# Patient Record
Sex: Female | Born: 1945 | Race: Asian | Hispanic: No | Smoking: Never smoker
Health system: Southern US, Community
[De-identification: ages and names within clinical notes are randomized; demographics above are authoritative.]

## PROBLEM LIST (undated history)

## (undated) DIAGNOSIS — T7840XA Allergy, unspecified, initial encounter: Secondary | ICD-10-CM

## (undated) DIAGNOSIS — R4189 Other symptoms and signs involving cognitive functions and awareness: Secondary | ICD-10-CM

## (undated) DIAGNOSIS — L309 Dermatitis, unspecified: Secondary | ICD-10-CM

## (undated) DIAGNOSIS — E039 Hypothyroidism, unspecified: Secondary | ICD-10-CM

## (undated) HISTORY — DX: Allergy, unspecified, initial encounter: T78.40XA

## (undated) HISTORY — DX: Other symptoms and signs involving cognitive functions and awareness: R41.89

## (undated) HISTORY — DX: Dermatitis, unspecified: L30.9

## (undated) HISTORY — DX: Hypothyroidism, unspecified: E03.9

---

## 1995-11-28 HISTORY — PX: OTHER SURGICAL HISTORY: SHX169

## 2015-02-10 ENCOUNTER — Emergency Department (HOSPITAL_COMMUNITY)
Admission: EM | Admit: 2015-02-10 | Discharge: 2015-02-10 | Disposition: A | Discharge status: Home / Self Care | Payer: Medicaid Other | Source: Home / Self Care | Attending: Family Medicine | Admitting: Family Medicine

## 2015-02-10 ENCOUNTER — Encounter (HOSPITAL_COMMUNITY): Payer: Self-pay | Admitting: Emergency Medicine

## 2015-02-10 DIAGNOSIS — H6012 Cellulitis of left external ear: Secondary | ICD-10-CM

## 2015-02-10 MED ORDER — CEFUROXIME AXETIL 500 MG PO TABS: 500.0000 mg | ORAL_TABLET | Freq: Two times a day (BID) | ORAL | Status: DC

## 2015-02-10 MED ORDER — MUPIROCIN 2 % EX OINT: 1.0000 "application " | TOPICAL_OINTMENT | Freq: Two times a day (BID) | CUTANEOUS | Status: DC

## 2015-02-10 MED ORDER — NEOMYCIN-POLYMYXIN-HC 3.5-10000-1 OT SUSP: 4.0000 [drp] | Freq: Three times a day (TID) | OTIC | Status: DC

## 2015-02-10 NOTE — ED Provider Notes (Signed)
Sabrina Garrison is a 69 y.o. female who presents to Urgent Care today for bilateral ear pain for years. Patient notes the left ear is more tender than the right. She has not had any treatment yet. No change in hearing. No fevers or chills vomiting or diarrhea. Patient notes that she frequently coughs after meals and has a burning sensation in her esophagus and a sour taste in her mouth. She has not had any medications for this either.   History reviewed. No pertinent past medical history. History reviewed. No pertinent past surgical history. History  Substance Use Topics  . Smoking status: Never Smoker   . Smokeless tobacco: Not on file  . Alcohol Use: No   ROS as above Medications: No current facility-administered medications for this encounter.   Current Outpatient Prescriptions  Medication Sig Dispense Refill  . cefUROXime (CEFTIN) 500 MG tablet Take 1 tablet (500 mg total) by mouth 2 (two) times daily with a meal. 14 tablet 0  . mupirocin ointment (BACTROBAN) 2 % Apply 1 application topically 2 (two) times daily. 30 g 0  . neomycin-polymyxin-hydrocortisone (CORTISPORIN) 3.5-10000-1 otic suspension Place 4 drops into both ears 3 (three) times daily. 10 mL 0   No Known Allergies   Exam:  BP 111/62 mmHg  Pulse 63  Temp(Src) 98 F (36.7 C) (Oral)  Resp 15  SpO2 99% Gen: Well NAD HEENT: EOMI,  MMM left ear tragus is inflamed and tender with erythema. The ear canal is mildly erythematous. The right ear canal is normal-appearing. Mastoids are nontender bilaterally Lungs: Normal work of breathing. CTABL Heart: RRR no MRG Abd: NABS, Soft. Nondistended, Nontender Exts: Brisk capillary refill, warm and well perfused.   No results found for this or any previous visit (from the past 24 hour(s)). No results found.  Assessment and Plan: 69 y.o. female with  1) bilateral ear pain with left ear cellulitis. Treat with Ceftin, mupirocin and Cortisporin drops 2) reflux likely cause of chronic  cough. Doubtful for aspiration with normal oxygen saturation and normal lung exam. Treat with a trial of omeprazole. Follow-up with PCP.   [place] phone interpreter used  Discussed warning signs or symptoms. Please see discharge instructions. Patient expresses understanding.     Rodolph BongEvan S Alexcia Schools, MD 02/10/15 [phone]

## 2015-02-10 NOTE — Discharge Instructions (Signed)
Thank you for coming in today. ° °Cellulitis °Cellulitis is an infection of the skin and the tissue beneath it. The infected area is usually red and tender. Cellulitis occurs most often in the arms and lower legs.  °CAUSES  °Cellulitis is caused by bacteria that enter the skin through cracks or cuts in the skin. The most common types of bacteria that cause cellulitis are staphylococci and streptococci. °SIGNS AND SYMPTOMS  °· Redness and warmth. °· Swelling. °· Tenderness or pain. °· Fever. °DIAGNOSIS  °Your health care provider can usually determine what is wrong based on a physical exam. Blood tests may also be done. °TREATMENT  °Treatment usually involves taking an antibiotic medicine. °HOME CARE INSTRUCTIONS  °· Take your antibiotic medicine as directed by your health care provider. Finish the antibiotic even if you start to feel better. °· Keep the infected arm or leg elevated to reduce swelling. °· Apply a warm cloth to the affected area up to 4 times per day to relieve pain. °· Take medicines only as directed by your health care provider. °· Keep all follow-up visits as directed by your health care provider. °SEEK MEDICAL CARE IF:  °· You notice red streaks coming from the infected area. °· Your red area gets larger or turns dark in color. °· Your bone or joint underneath the infected area becomes painful after the skin has healed. °· Your infection returns in the same area or another area. °· You notice a swollen bump in the infected area. °· You develop new symptoms. °· You have a fever. °SEEK IMMEDIATE MEDICAL CARE IF:  °· You feel very sleepy. °· You develop vomiting or diarrhea. °· You have a general ill feeling (malaise) with muscle aches and pains. °MAKE SURE YOU:  °· Understand these instructions. °· Will watch your condition. °· Will get help right away if you are not doing well or get worse. °Document Released: 08/23/2005 Document Revised: 03/30/2014 Document Reviewed: 01/29/2012 °[employer]® Patient  Information ©2015 [employer]. This information is not intended to replace advice given to you by your health care provider. Make sure you discuss any questions you have with your health care provider. ° °

## 2015-02-10 NOTE — ED Notes (Signed)
[place] Pt c/o bilateral ear pain  See physicians note Alert, no signs of acute distress.

## 2015-03-02 ENCOUNTER — Emergency Department (HOSPITAL_COMMUNITY)
Admission: EM | Admit: 2015-03-02 | Discharge: 2015-03-02 | Disposition: A | Discharge status: Home / Self Care | Payer: Medicaid Other | Attending: Emergency Medicine | Admitting: Emergency Medicine

## 2015-03-02 DIAGNOSIS — Z79899 Other long term (current) drug therapy: Secondary | ICD-10-CM | POA: Diagnosis not present

## 2015-03-02 DIAGNOSIS — Z7952 Long term (current) use of systemic steroids: Secondary | ICD-10-CM | POA: Insufficient documentation

## 2015-03-02 DIAGNOSIS — Z792 Long term (current) use of antibiotics: Secondary | ICD-10-CM | POA: Insufficient documentation

## 2015-03-02 DIAGNOSIS — R21 Rash and other nonspecific skin eruption: Secondary | ICD-10-CM | POA: Diagnosis present

## 2015-03-02 DIAGNOSIS — L259 Unspecified contact dermatitis, unspecified cause: Secondary | ICD-10-CM | POA: Diagnosis not present

## 2015-03-02 MED ORDER — PREDNISONE 20 MG PO TABS: ORAL_TABLET | ORAL | Status: DC

## 2015-03-02 MED ORDER — DIPHENHYDRAMINE HCL 25 MG PO TABS: 25.0000 mg | ORAL_TABLET | Freq: Four times a day (QID) | ORAL | Status: DC

## 2015-03-02 MED ORDER — PREDNISONE 20 MG PO TABS
60.0000 mg | ORAL_TABLET | Freq: Once | ORAL | Status: AC
  Administered 2015-03-02: 60 mg via ORAL
  Filled 2015-03-02: qty 3

## 2015-03-02 MED ORDER — DIPHENHYDRAMINE HCL 25 MG PO CAPS
25.0000 mg | ORAL_CAPSULE | Freq: Once | ORAL | Status: AC
  Administered 2015-03-02: 25 mg via ORAL
  Filled 2015-03-02: qty 1

## 2015-03-02 NOTE — ED Notes (Signed)
Pt presents with allergic reaction XC 4 days to unknown source. Pt condition is acute persistent and worsening in nature. Condition is made worse by nothing. Condition is made better by nothing. Pt has had not treatment prior to arrival.

## 2015-03-02 NOTE — ED Notes (Signed)
MD at bedside. 

## 2015-03-02 NOTE — ED Notes (Signed)
Translator lin does not have a translator that speaks this dilaletic of burmese at this time. Verified allergies using friend on telephone

## 2015-03-02 NOTE — ED Provider Notes (Signed)
CSN: [id number]     Arrival date & time 03/02/15  6962 History   First MD Initiated Contact with Patient 03/02/15 [phone]     Chief Complaint  Patient presents with  . Allergic Reaction     (Consider location/radiation/quality/duration/timing/severity/associated sxs/prior Treatment) HPI  Communication limitation based on language barrier. The translator company did not have a Nurse, learning disability available for this particular [place]ese dialect. Via a friend on the telephone we were able to establish some history. The patient is coming in for a allergic reaction. There was not report of general illness as concern. Reviewing the prior medical record there is indication that the patient has had problems with chronic ear pain and was treated in mid March with mupirocin and neomycin for an early otitis externa. The patient should be finished with these medications based on their prescription date of March 16. No past medical history on file. No past surgical history on file. No family history on file. History  Substance Use Topics  . Smoking status: Never Smoker   . Smokeless tobacco: Not on file  . Alcohol Use: No   OB History    No data available     Review of Systems  Via the limitations of interview there is not report of acute illness.  Allergies  Review of patient's allergies indicates no known allergies.  Home Medications   Prior to Admission medications   Medication Sig Start Date End Date Taking? Authorizing Provider  cefUROXime (CEFTIN) 500 MG tablet Take 1 tablet (500 mg total) by mouth 2 (two) times daily with a meal. 02/10/15   Rodolph Bong, MD  diphenhydrAMINE (BENADRYL) 25 MG tablet Take 1 tablet (25 mg total) by mouth every 6 (six) hours. 03/02/15   Arby Barrette, MD  mupirocin ointment (BACTROBAN) 2 % Apply 1 application topically 2 (two) times daily. 02/10/15   Rodolph Bong, MD  neomycin-polymyxin-hydrocortisone (CORTISPORIN) 3.5-10000-1 otic suspension Place 4 drops into both ears 3  (three) times daily. 02/10/15   Rodolph Bong, MD  predniSONE (DELTASONE) 20 MG tablet 3 tabs po daily x 3 days, then 2 tabs x 3 days, then 1.5 tabs x 3 days, then 1 tab x 3 days, then 0.5 tabs x 3 days 03/02/15   Arby Barrette, MD   BP 103/58 mmHg  Temp(Src) 98.2 F (36.8 C) (Oral)  Resp 14  Ht 5' (1.524 m)  Wt 120 lb (54.432 kg)  BMI 23.44 kg/m2  SpO2 100% Physical Exam  Constitutional: She appears well-developed and well-nourished. No distress.  HENT:  The patient has bilateral periorbital edema. She is able to spontaneously open the eyes by about 5 mm. Extraocular motions are intact and there is no scleral injection or discharge. She has a diffuse edema and fine papular rash of the face. The intraoral cavity does not show any edema in the posterior or fax is widely patent. Teeth and tongue have black staining consistent with leaf chewing. There is no asymmetric facial swelling. Both of the patient's ears have diffuse erythema and edema. There is also papular rash at the back of the neck and high on the anterior chest. Some of this rash has some lichenification suggested of chronic itching behind the ears and at the nape of the neck. Also of note there is a bald patch above the left ear that is about 10 x 8 cm. Skin condition here is excellent without any rash present. The patient's neck is supple without meningismus. Right TM is normal appearance. Left  TM has tympanic sclerosis but no erythema or bulging.  Neck: Neck supple.  Cardiovascular: Normal rate, regular rhythm, normal heart sounds and intact distal pulses.   Pulmonary/Chest: Effort normal and breath sounds normal.  Abdominal: Soft. Bowel sounds are normal. She exhibits no distension. There is no tenderness.  Musculoskeletal: Normal range of motion. She exhibits no edema or tenderness.  Neurological: She is alert. No cranial nerve deficit. She exhibits normal muscle tone. Coordination normal.  Skin: Skin is warm and dry.  Aside from  the described rash on the patient's head and neck, the extremities and chronic are free of rash. She does have one focal lesion on the knee that is oval in shape and 7 x 5 cm. This lesion has a silvery top to it with no surrounding erythema. The knee itself has no effusion or deformity. 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 The daughter indicates its been there for several years.    ED Course  Procedures (including critical care time) Labs Review Labs Reviewed - No data to display  Imaging Review No results found.   EKG Interpretation None      MDM   Final diagnoses:  Contact dermatitis   Physical examination findings are consistent with contact dermatitis. There is some appearance of chronicity with some lichenification behind the ears and at the nape of the neck. There is also a focal area of alopecia on the side of the patient's head that does not have any associated rash. At this time she will be treated for contact dermatitis with prednisone and Benadryl. I have indicated to the patient's daughter the need to follow-up at the wellness clinic and establish a provider that can plan for communication were to bring a friend who can act as a Nurse, learning disabilitytranslator. The patient is nontoxic with stable vital signs and at this point appears stable for discharge.    Arby BarretteMarcy Marnie Fazzino, MD 03/02/15 1106

## 2015-03-02 NOTE — Discharge Instructions (Signed)

## 2015-09-17 ENCOUNTER — Encounter (HOSPITAL_COMMUNITY): Payer: Self-pay | Admitting: Emergency Medicine

## 2015-09-17 ENCOUNTER — Emergency Department (HOSPITAL_COMMUNITY)
Admission: EM | Admit: 2015-09-17 | Discharge: 2015-09-17 | Disposition: A | Discharge status: Home / Self Care | Payer: Medicaid Other | Attending: Emergency Medicine | Admitting: Emergency Medicine

## 2015-09-17 DIAGNOSIS — L259 Unspecified contact dermatitis, unspecified cause: Secondary | ICD-10-CM | POA: Insufficient documentation

## 2015-09-17 DIAGNOSIS — M542 Cervicalgia: Secondary | ICD-10-CM | POA: Diagnosis not present

## 2015-09-17 DIAGNOSIS — Z79899 Other long term (current) drug therapy: Secondary | ICD-10-CM | POA: Diagnosis not present

## 2015-09-17 DIAGNOSIS — R21 Rash and other nonspecific skin eruption: Secondary | ICD-10-CM | POA: Diagnosis present

## 2015-09-17 MED ORDER — PREDNISONE 20 MG PO TABS: ORAL_TABLET | ORAL | Status: DC

## 2015-09-17 MED ORDER — MUPIROCIN 2 % EX OINT: 1.0000 "application " | TOPICAL_OINTMENT | Freq: Two times a day (BID) | CUTANEOUS | Status: DC

## 2015-09-17 MED ORDER — CEPHALEXIN 500 MG PO CAPS: 500.0000 mg | ORAL_CAPSULE | Freq: Four times a day (QID) | ORAL | Status: DC

## 2015-09-17 NOTE — ED Notes (Addendum)
Pt here for rash and neck pain. Rash began in [facility], pt took medication and rash went away but returned. Pt speaks [place].

## 2015-09-17 NOTE — Discharge Instructions (Signed)
1. Medications: OTC Benadryl every 6 hours as needed for itching, bactroban, prednisone, keflex, usual home medications 2. Treatment: rest, drink plenty of fluids, take medications as prescribed 3. Follow Up: if you do not have a primary care doctor use the resource guide provided to find one; followup with dermatology as soon as possible; Return to the ER for difficulty breathing, return of allergic reaction or other concerning symptoms    Contact Dermatitis Dermatitis is redness, soreness, and swelling (inflammation) of the skin. Contact dermatitis is a reaction to certain substances that touch the skin. There are two types of contact dermatitis:   Irritant contact dermatitis. This type is caused by something that irritates your skin, such as dry hands from washing them too much. This type does not require previous exposure to the substance for a reaction to occur. This type is more common.  Allergic contact dermatitis. This type is caused by a substance that you are allergic to, such as a nickel allergy or poison ivy. This type only occurs if you have been exposed to the substance (allergen) before. Upon a repeat exposure, your body reacts to the substance. This type is less common. CAUSES  Many different substances can cause contact dermatitis. Irritant contact dermatitis is most commonly caused by exposure to:   Makeup.   Soaps.   Detergents.   Bleaches.   Acids.   Metal salts, such as nickel.  Allergic contact dermatitis is most commonly caused by exposure to:   Poisonous plants.   Chemicals.   Jewelry.   Latex.   Medicines.   Preservatives in products, such as clothing.  RISK FACTORS This condition is more likely to develop in:   People who have jobs that expose them to irritants or allergens.  People who have certain medical conditions, such as asthma or eczema.  SYMPTOMS  Symptoms of this condition may occur anywhere on your body where the irritant  has touched you or is touched by you. Symptoms include:  Dryness or flaking.   Redness.   Cracks.   Itching.   Pain or a burning feeling.   Blisters.  Drainage of small amounts of blood or clear fluid from skin cracks. With allergic contact dermatitis, there may also be swelling in areas such as the eyelids, mouth, or genitals.  DIAGNOSIS  This condition is diagnosed with a medical history and physical exam. A patch skin test may be performed to help determine the cause. If the condition is related to your job, you may need to see an occupational medicine specialist. TREATMENT Treatment for this condition includes figuring out what caused the reaction and protecting your skin from further contact. Treatment may also include:   Steroid creams or ointments. Oral steroid medicines may be needed in more severe cases.  Antibiotics or antibacterial ointments, if a skin infection is present.  Antihistamine lotion or an antihistamine taken by mouth to ease itching.  A bandage (dressing). HOME CARE INSTRUCTIONS Skin Care  Moisturize your skin as needed.   Apply cool compresses to the affected areas.  Try taking a bath with:  Epsom salts. Follow the instructions on the packaging. You can get these at your local pharmacy or grocery store.  Baking soda. Pour a small amount into the bath as directed by your health care provider.  Colloidal oatmeal. Follow the instructions on the packaging. You can get this at your local pharmacy or grocery store.  Try applying baking soda paste to your skin. Stir water into baking soda  until it reaches a paste-like consistency.  Do not scratch your skin.  Bathe less frequently, such as every other day.  Bathe in lukewarm water. Avoid using hot water. Medicines  Take or apply over-the-counter and prescription medicines only as told by your health care provider.   If you were prescribed an antibiotic medicine, take or apply your  antibiotic as told by your health care provider. Do not stop using the antibiotic even if your condition starts to improve. General Instructions  Keep all follow-up visits as told by your health care provider. This is important.  Avoid the substance that caused your reaction. If you do not know what caused it, keep a journal to try to track what caused it. Write down:  What you eat.  What cosmetic products you use.  What you drink.  What you wear in the affected area. This includes jewelry.  If you were given a dressing, take care of it as told by your health care provider. This includes when to change and remove it. SEEK MEDICAL CARE IF:   Your condition does not improve with treatment.  Your condition gets worse.  You have signs of infection such as swelling, tenderness, redness, soreness, or warmth in the affected area.  You have a fever.  You have new symptoms. SEEK IMMEDIATE MEDICAL CARE IF:   You have a severe headache, neck pain, or neck stiffness.  You vomit.  You feel very sleepy.  You notice red streaks coming from the affected area.  Your bone or joint underneath the affected area becomes painful after the skin has healed.  The affected area turns darker.  You have difficulty breathing.   This information is not intended to replace advice given to you by your health care provider. Make sure you discuss any questions you have with your health care provider.   Document Released: 11/10/2000 Document Revised: 08/04/2015 Document Reviewed: 03/31/2015 Elsevier Interactive Patient Education 2016 [employer].    Emergency Department Resource Guide 1) Find a Doctor and Pay Out of Pocket Although you won't have to find out who is covered by your insurance plan, it is a good idea to ask around and get recommendations. You will then need to call the office and see if the doctor you have chosen will accept you as a new patient and what types of options they offer  for patients who are self-pay. Some doctors offer discounts or will set up payment plans for their patients who do not have insurance, but you will need to ask so you aren't surprised when you get to your appointment.  2) Contact Your Local Health Department Not all health departments have doctors that can see patients for sick visits, but many do, so it is worth a call to see if yours does. If you don't know where your local health department is, you can check in your phone book. The CDC also has a tool to help you locate your state's health department, and many state websites also have listings of all of their local health departments.  3) Find a Walk-in Clinic If your illness is not likely to be very severe or complicated, you may want to try a walk in clinic. These are popping up all over the country in pharmacies, drugstores, and shopping centers. They're usually staffed by nurse practitioners or physician assistants that have been trained to treat common illnesses and complaints. They're usually fairly quick and inexpensive. However, if you have serious medical issues or chronic  medical problems, these are probably not your best option.  No Primary Care Doctor: - Call Health Connect at  [phone] - they can help you locate a primary care doctor that  accepts your insurance, provides certain services, etc. - Physician Referral Service- [phone]  Chronic Pain Problems: Organization         Address  Phone   Notes  [facility]  [phone] Patients need to be referred by their primary care doctor.   Medication Assistance: Organization         Address  Phone   Notes  [facility] Medication [facility] [address] [place], [place] [address] [phone] --Must be a resident of [place] -- Must have NO insurance coverage whatsoever (no Medicaid/ Medicare, etc.) -- The pt. MUST have a primary care doctor that directs their care  regularly and follows them in the community   [facility]ist  [phone]   [employer]  [phone]    Agencies that provide inexpensive medical care: Organization         Address  Phone   Notes  [facility]  [phone]   [facility]    [phone]   [facility] [address] [place], [place] [address] [phone]   [facility] [address] [place], [place] [phone]   Planned Parenthood    [phone]   [facility]    [phone]   [facility]  [address], [place] Phone:  [phone], Fax:  [phone] Hours of Operation:  9 am - 6 pm, M-F.  Also accepts Medicaid/Medicare and self-pay.  [facility]  [address], [place] Phone: [phone], Fax: [phone]. Hours of Operation:  8:30 am - 5:30 pm, M-F.  Also accepts Medicaid and self-pay.  [facility] [place] [address] [place] Phone: [phone]   [facility] [address] Natasha Bence [place], [place] [phone], Ext. [phone] Mondays & Thursdays: 7-9 AM.  First 15 patients are seen on a first come, first serve basis.    Medicaid-accepting [facility] Providers:  Organization         Address  Phone   Notes  [facility] [address], [place] [phone] Also accepts self-pay patients.  [facility] [address] Laurell Josephs [place], [place]  [phone]   [facility] [address], [place] [phone]   [facility] [address] [place] [phone]   Renaye Rakers [address], [place]   [phone] Only accepts [place] patients after they have their name applied to their card.   Self-Pay (no insurance) in [facility]:  Organization         Address  Phone    Notes  Sickle Cell Patients, [facility] Internal Medicine [address] [place], [place] [phone]   [facility] [address] [place], [place] [phone]   [facility]  [address], [place] [phone]   [facility]/Dr. Osei-Bonsu  [address], [place] or [address], [place] [phone] Phone number for both [place] and [place] locations is the same.  [facility] [address] [place] [phone]   [facility] [address] [place] or [address] [phone]   [facility] [address] [place] [phone], phone; [phone], fax Sees patients 1st and 3rd Saturday of every month.  Must not qualify for public or private insurance (i.e. Medicaid, Medicare, [place] Health Choice, Veterans' Benefits)  Household income should be no more than 200% of the poverty level The clinic cannot treat you if you are pregnant or think you are pregnant  Sexually transmitted diseases are not treated at the clinic.    Dental Care: Organization         Address  Phone  Notes  [facility] Department of [facility] [address] [place], [place] [phone] Accepts children up to age 36 who are enrolled in [place] or [place] Health Choice; pregnant women with a Medicaid card; and children who have applied for Medicaid or [place] Health Choice, but were declined, whose parents can pay a reduced fee at time of service.  [facility] of [facility]  [address], [place] [phone] Accepts children up to age 56 who are enrolled in [place] or [place] Health Choice; pregnant women with a Medicaid card; and children who have applied for Medicaid or  Health Choice, but were declined, whose parents can pay a reduced fee at time of service.  [facility]  [address] [place], [place] [phone] Patients are seen by appointment only. Walk-ins are not accepted. [facility] will see patients 72 years of age and older. Monday - Tuesday (8am-5pm) Most Wednesdays (8:30-5pm) $30 per visit, cash only  [facility] Adult Dental Access PROGRAM  [address], [facility] [phone] Patients are seen by appointment only. Walk-ins are not accepted. [facility] will see patients 35 years of age and older. One Wednesday Evening (Monthly: Volunteer Based).  $30 per visit, cash only  [employer]  [phone] for adults; Children under age 66, call Graduate Pediatric Dentistry at [phone]. Children aged 44-14, please call [phone] to request a pediatric application.  Dental services are provided in all areas of dental care including fillings, crowns and bridges, complete and partial dentures, implants, gum treatment, root canals, and extractions. Preventive care is also provided. Treatment is provided to both adults and children. Patients are selected via a lottery and there is often a waiting list.   [facility] [address], [place]  [phone] [email or web address]   [facility] [address] [place], [place] [phone], Ext. [phone] Second and Fourth Thursday of each month, opens at 6:30 AM; Clinic ends at 9 AM.  Patients are seen on a first-come first-served basis, and a limited number are seen during each clinic.   [facility]  [address] Ether Griffins [place], [place] [phone]   Eligibility Requirements You must have lived in [place], [place], or [place] counties for at least the last three months.   You cannot be eligible for state or federal sponsored [place], including [employer], [place], or [employer].   You generally cannot be eligible for healthcare insurance through your employer.    How to  apply: Eligibility screenings are held every Tuesday and Wednesday afternoon from 1:00 pm until 4:00 pm. You do not need an appointment for the interview!  [facility] [address], [place], [place] [phone]   [facility]  [phone]   [facility]  [phone]   [facility]  [phone]    Behavioral Health Resources in the Community: Intensive Outpatient Programs Organization         Address  Phone  Notes  [facility] [address], [place], [place] [phone]   [facility] [address], [place], [place] [phone]   [facility] [address], [place], [place]  [phone]   [facility] [address],  [place], [place] [phone] or [phone]   Substance Abuse Resources Organization         Address  Phone  Notes  [facility]  [phone]   [facility]  [phone]   [facility]  [phone]   [facility]  [phone]   Residential & Outpatient Substance Abuse Program  [phone]   Psychological Services Organization         Address  Phone  Notes  [facility]  [phone]- [phone]   [facility]  [phone]- [phone]   [facility] [address], [place] [phone] or [phone]    Mobile Crisis Teams Organization         Address  Phone  Notes  [facility], Mobile Crisis Care Unit  [phone]   [facility]  [address] [place], [place] [phone]   Doristine LocksSharon DeEsch [address] [place] [phone]    Self-Help/Support Groups Organization         Address  Phone             Notes  [facility] [place] - variety of support groups  [phone] Call for more information  Narcotics Anonymous (NA), [facility] [address], [place]  2 meetings at this location   Statisticianesidential Treatment Programs Organization         Address  Phone  Notes  [facility] [address],    [place]  [phone]   [facility]  [address], [place] [phone], [place], [place] [phone]   [facility] [address], [place] [phone] Admissions: 8am-3pm M-F  [facility] [address]    [place], [place] [phone]   [facility] [address], [place], [place] [phone]   [facility] [address]  [place], [place] [phone]   [facility] - Intensive Outpatient [address], [place], [place] [phone]   [facility] ([facility] [address]  [place], [place] [phone] or [phone]   [facility] ([facility]) [address] [place], [place] [phone] Accepts Medicaid  [facility] [address]  [place] [phone] Substance Abuse/Addiction Treatment   [facility] Resources Organization         Address  Phone  Notes  [facility]  [phone]   Angie FavaJulie Brannon, PhD [address], [address] [place], [place]   [phone] or [phone]   [facility]   [address] [place], [place] [phone]   [facility] [address], [place], [place] [phone] Insurance/Medicaid/sponsorship through [employer] [facility]  [facility] Families [address]  [place], [place] [phone] [place], [place] [phone]    Dr. Adele Schilder  [phone]   [facility] Dept. 1) [address] [place] 2) [place] 3)  [address], [place] [phone]  [phone]   [place]  [phone] or [phone] (After Hours)

## 2015-09-17 NOTE — ED Notes (Signed)
intepreter line used.

## 2015-09-17 NOTE — ED Provider Notes (Signed)
CSN: [id number]     Arrival date & time 09/17/15  1046 History  By signing my name below, I, Sabrina Garrison, attest that this documentation has been prepared under the direction and in the presence of [employer] CorpHannah Maisee Vollman, PA-C. Electronically Signed: Elon SpannerGarrett Garrison ED Scribe. 09/17/2015. 4:17 PM.    Chief Complaint  Patient presents with  . Neck Pain  . Rash   Patient is a 69 y.o. female presenting with rash. The history is provided by the patient and medical records. The history is limited by a language barrier. A language interpreter was used.  Rash Associated symptoms: no abdominal pain, no diarrhea, no fatigue, no fever, no headaches, no nausea, no shortness of breath, not vomiting and not wheezing    HPI Comments: Sabrina Garrison is a [age] y.o. female who presents to the Emergency Department complaining of a generalized, itching rash on the head, neck, underarms, and upper/inner thighs onset 01/2015.  She reports some associated pain superficially in the neck that she attributes to the rash.  Associated symptoms include intermittent chills, nausea, and difficulty sleeping due to pain. The patient was seen in the ED for the same 03/02/2015.  At that time she was diagnosed with contact dermatitis and prescribed benadryl and prednisone.  Her rash improved with these medications but did not resolve, eventually returning to its former state.  She was seen again by an unknown provider sometime between 02/2015 and 04/2015. At that time she was prescribed clobetasol ointment, Bactroban, Ceftin, and 2.5% hydrocortisone cream.  She has used these without improvement.  She has also taken Levaquin.  Patient denies international travel since [place] the states from [place] in 2013.  The patient began using a new soap two weeks ago with worsening itching.  She denies vomiting.  NKDA.  Per family member, with the exception of the rash, the patient's general appearance and skin color is normal to baseline. Patient is unsure of  any chronic kidney or liver conditions.    Patient was accompanied by a family member who provided parts of the history.     History reviewed. No pertinent past medical history. History reviewed. No pertinent past surgical history. No family history on file. Social History  Substance Use Topics  . Smoking status: Never Smoker   . Smokeless tobacco: None  . Alcohol Use: No   OB History    No data available     Review of Systems  Constitutional: Negative for fever, diaphoresis, appetite change, fatigue and unexpected weight change.  HENT: Negative for mouth sores.   Eyes: Negative for visual disturbance.  Respiratory: Negative for cough, chest tightness, shortness of breath and wheezing.   Cardiovascular: Negative for chest pain.  Gastrointestinal: Negative for nausea, vomiting, abdominal pain, diarrhea and constipation.  Endocrine: Negative for polydipsia, polyphagia and polyuria.  Genitourinary: Negative for dysuria, urgency, frequency and hematuria.  Musculoskeletal: Negative for back pain and neck stiffness.  Skin: Positive for rash.  Allergic/Immunologic: Negative for immunocompromised state.  Neurological: Negative for syncope, light-headedness and headaches.  Hematological: Does not bruise/bleed easily.  Psychiatric/Behavioral: Negative for sleep disturbance. The patient is not nervous/anxious.       Allergies  Review of patient's allergies indicates no known allergies.  Home Medications   Prior to Admission medications   Medication Sig Start Date End Date Taking? Authorizing Provider  cefUROXime (CEFTIN) 500 MG tablet Take 1 tablet (500 mg total) by mouth 2 (two) times daily with a meal. 02/10/15   Rodolph BongEvan S Corey, MD  cephALEXin (KEFLEX) 500 MG capsule Take 1 capsule (500 mg total) by mouth 4 (four) times daily. 09/17/15   Matej Sappenfield, PA-C  diphenhydrAMINE (BENADRYL) 25 MG tablet Take 1 tablet (25 mg total) by mouth every 6 (six) hours. 03/02/15   Arby Barrette, MD  mupirocin ointment (BACTROBAN) 2 % Apply 1 application topically 2 (two) times daily. 09/17/15   Lakeesha Fontanilla, PA-C  neomycin-polymyxin-hydrocortisone (CORTISPORIN) 3.5-10000-1 otic suspension Place 4 drops into both ears 3 (three) times daily. 02/10/15   Rodolph Bong, MD  predniSONE (DELTASONE) 20 MG tablet 3 tabs po daily x 3 days, then 2 tabs x 3 days, then 1.5 tabs x 3 days, then 1 tab x 3 days, then 0.5 tabs x 3 days 09/17/15   Dahlia Client Nyjae Hodge, PA-C   BP 111/73 mmHg  Pulse 68  Temp(Src) 97.6 F (36.4 C) (Oral)  Resp 18  Wt 114 lb 3 oz (51.795 kg)  SpO2 100% Physical Exam  Constitutional: She appears well-developed and well-nourished. No distress.  Awake, alert, nontoxic appearance  HENT:  Head: Normocephalic and atraumatic.  Mouth/Throat: Oropharynx is clear and moist. No oropharyngeal exudate.  Eyes: Conjunctivae are normal. No scleral icterus.  Neck: Normal range of motion. Neck supple.  Full range of motion of the neck No stridor Normal phonation Handling secretions without difficulty  Cardiovascular: Normal rate, regular rhythm, normal heart sounds and intact distal pulses.   No murmur heard. Pulmonary/Chest: Effort normal and breath sounds normal. No respiratory distress. She has no wheezes.  Equal chest expansion Clear and equal breath sounds without wheezing or rhonchi  Abdominal: Soft. Bowel sounds are normal. She exhibits no mass. There is no tenderness. There is no rebound and no guarding.  Musculoskeletal: Normal range of motion. She exhibits no edema.  Neurological: She is alert.  Speech is clear and goal oriented Moves extremities without ataxia  Skin: Skin is warm and dry. She is not diaphoretic.  Irritated and inflamed rash with edema and fissures noted circumferentially around the neck and extending onto the chest patches of this noted to the area and inferior portions of the bilateral breasts Rash noted to the majority of the bilateral  upper arms and along the right knee No purulent drainage, induration or increased warmth  Psychiatric: She has a normal mood and affect.  Nursing note and vitals reviewed.   ED Course  Procedures (including critical care time)  DIAGNOSTIC STUDIES: Oxygen Saturation is 96% on RA, adequate by my interpretation.    COORDINATION OF CARE:  12:01 PM Will consult with attending and prescribe oral steroids.  Patient will be referred to dermatologist.  Patient acknowledges and agrees with plan.     MDM   Final diagnoses:  Contact dermatitis  Rash and nonspecific skin eruption   Simisola Sandles presents with persistent rash.  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 Patient denies any difficulty breathing or swallowing.  Pt has a patent airway without stridor and is handling secretions without difficulty; no angioedema. No blisters, no pustules, no warmth, no draining sinus tracts, no superficial abscesses, no bullous impetigo, no vesicles, no desquamation, no target lesions with dusky purpura or a central bulla. Not tender to touch. Concern for beginning of secondary infection due to chronic excoriations and open fissures. No concern for SJS, TEN, TSS, tick borne illness, syphilis or other life-threatening condition. Will discharge home with short course of steroids along with Keflex and Bactroban. Discussed at length patients need to follow-up with  dermatology as she will need a biopsy of the rash.  BP 111/73 mmHg  Pulse 68  Temp(Src) 97.6 F (36.4 C) (Oral)  Resp 18  Wt 114 lb 3 oz (51.795 kg)  SpO2 100%  I personally performed the services described in this documentation, which was scribed in my presence. The recorded information has been reviewed and is accurate.   The patient was discussed with and seen by Dr. Karma Ganja who agrees with the treatment plan.    Dierdre Forth, PA-C 09/17/15 1617  Zoella Roberti, PA-C 09/17/15 [id number]  Jerelyn Scott, MD 09/18/15 [phone]

## 2015-11-28 DIAGNOSIS — E039 Hypothyroidism, unspecified: Secondary | ICD-10-CM

## 2015-11-28 HISTORY — DX: Hypothyroidism, unspecified: E03.9

## 2016-01-12 ENCOUNTER — Encounter: Payer: Self-pay | Admitting: Internal Medicine

## 2016-01-12 ENCOUNTER — Ambulatory Visit (INDEPENDENT_AMBULATORY_CARE_PROVIDER_SITE_OTHER): Payer: Medicaid Other | Admitting: Internal Medicine

## 2016-01-12 VITALS — BP 112/80 | HR 68 | Ht <= 58 in | Wt 120.0 lb

## 2016-01-12 DIAGNOSIS — R0600 Dyspnea, unspecified: Secondary | ICD-10-CM

## 2016-01-12 DIAGNOSIS — L299 Pruritus, unspecified: Secondary | ICD-10-CM

## 2016-01-12 DIAGNOSIS — H9193 Unspecified hearing loss, bilateral: Secondary | ICD-10-CM

## 2016-01-12 MED ORDER — TRIAMCINOLONE ACETONIDE 0.1 % EX CREA: TOPICAL_CREAM | CUTANEOUS | Status: DC

## 2016-01-12 NOTE — Progress Notes (Signed)
   Subjective:    Patient ID: Sabrina Garrison, female    DOB: [birth year], 70 y.o.   MRN: [id number]  HPI   1.  Hearing Problem:  Became a problem when pregnant with her 69 year old son.  Pt. States she was never told why she lost her hearing.  States she had pain in her ears, but no drainage.  Describes tinnitus constantly.    2.  History of neck surgery due to a large mass in her neck that was enlarging.  Has had problems with breathing since.  Surgery performed in [place] when at a refugee camp there.  Not a cancer.  Was taking a medication afterward, but stopped years ago.  Since surgery, voice has been a bit raspy and feels a bit dyspneic (from throat area)  3.  Itching:  All over.  Lives with 2 adult sons.  Has been bothering her for 1 year.  Lives in [place] at [address].     Review of Systems     Objective:   Physical Exam Delightfully spry woman HEENT:  PERRL, EOMI, TMs retracted clear and shiny, throat without injection. Neck:  Appears to be missing some musculature from right sternocleidomastoid muscle as well as central part of anterior neck--almost as if missing tracheal prominence.  No adenopathy, no mass Chest:  CTA, voice raspy and has to make a bit of an effort to speak.   CV:  RRR without murmur or rub, radial pulses normal and equal. Abd:  S, NT, No HSM or masses appreciated., +BS throughout Skin;  No rash, but very dry, has some thickening with redness and deeper skin folds about wrists and base of thumbs       Assessment & Plan:  1.  Loss of hearing with tinnitus:  Referral to ENT and audiology.  Will see if can get audiology done with ENT  2.  ?  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  Will ask ENT to evaluate as well, though likely nothing can be done at this point.  Will have patient return next week to have TSH checked when [place] back.  3.  Dry Skin with possibly mild eczema in  places:  Dove soap, rinse thoroughly, Eucerin Cream after bathing and one other time a day. Triamcinolone cream 0.1% to areas of rash and significant itching twice daily as needed. Referral to [facility] Evaluation

## 2016-01-12 NOTE — Patient Instructions (Signed)
Dove soap for bathing Eucerin Cream for Eczema relief cream to use all over after bathing Apply the Triamcinolone Cream only to really itchy areas--small amount

## 2016-01-26 ENCOUNTER — Ambulatory Visit (INDEPENDENT_AMBULATORY_CARE_PROVIDER_SITE_OTHER): Payer: Medicaid Other | Admitting: Internal Medicine

## 2016-01-26 ENCOUNTER — Encounter: Payer: Self-pay | Admitting: Internal Medicine

## 2016-01-26 VITALS — BP 118/70 | HR 72 | Temp 98.4°F | Resp 17 | Ht <= 58 in | Wt 119.0 lb

## 2016-01-26 DIAGNOSIS — H9313 Tinnitus, bilateral: Secondary | ICD-10-CM | POA: Insufficient documentation

## 2016-01-26 DIAGNOSIS — Z9889 Other specified postprocedural states: Secondary | ICD-10-CM

## 2016-01-26 DIAGNOSIS — Z23 Encounter for immunization: Secondary | ICD-10-CM

## 2016-01-26 DIAGNOSIS — L309 Dermatitis, unspecified: Secondary | ICD-10-CM | POA: Diagnosis not present

## 2016-01-26 DIAGNOSIS — H9190 Unspecified hearing loss, unspecified ear: Secondary | ICD-10-CM | POA: Insufficient documentation

## 2016-01-26 DIAGNOSIS — H9193 Unspecified hearing loss, bilateral: Secondary | ICD-10-CM

## 2016-01-26 DIAGNOSIS — R519 Headache, unspecified: Secondary | ICD-10-CM

## 2016-01-26 DIAGNOSIS — R51 Headache: Secondary | ICD-10-CM | POA: Diagnosis not present

## 2016-01-26 DIAGNOSIS — E039 Hypothyroidism, unspecified: Secondary | ICD-10-CM

## 2016-01-26 DIAGNOSIS — J302 Other seasonal allergic rhinitis: Secondary | ICD-10-CM

## 2016-01-26 MED ORDER — CLOBETASOL PROP EMOLLIENT BASE 0.05 % EX CREA: TOPICAL_CREAM | CUTANEOUS | Status: DC

## 2016-01-26 MED ORDER — FEXOFENADINE HCL 30 MG/5ML PO SUSP: ORAL | Status: DC

## 2016-01-26 NOTE — Progress Notes (Signed)
   Subjective:    Patient ID: Sabrina Garrison, female    DOB: 1946-11-13, 70 y.o.   MRN: [id number]  HPI  1.  Itching of skin:  All over, but back of neck, arms and hands the worst.  States better than before, but when used Triamcinolone cream, made it worse.  Is using Eucerin Eczema Relief once daily.  Feels the latter is helping, but still with above spots with itching.  2.  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 With Dr. Jearld Fenton, [facility] on March 7th at 3:30 p.m. For hearing loss/tinnitus and the possibility of vocal cord paralysis.  3.  Cough for 1 week.  Wants a cough medicine, not a pill.  Does have itchy water eyes, nose with sneezing, and itchy throat.  Has not had this in the spring before.  Has headaches as well.  Feels like head will pop.     Review of Systems     Objective:   Physical Exam Happy, smiling. HEENT:  PERRL, EOMI, conjunctivae mildly injected, TMs retracted and shiny, canals dry and a bit flaky, throat with mild cobbling, nasal mucosa swollen and boggy Neck:  Supple, no adenopathy.  Loss of SCM muscle head on left from surgery. Chest:  CTA CV:  RRR without murmur or rub Skin:  Thickening and lichenification of posterior neck and dorsum of bilateral hands between thumb and index fingers.      Assessment & Plan:  1.  Eczema:  Try Clobetasol emollient cream--used Triamcinolone cream only once and felt it made itching worse--suspect burned a bit, so will change to emollient cream.   To use Eucerin Cream for eczema relief twice daily  2.  Seasonal Allergies:  To try Fexofenadine 30 mg/5 ml, 10 ml twice daily (does not want pills)  3.  Hearing loss, tinnitus, possible vocal cord paralysis: Has appt. This month with ENT. [name] plans to attend with her.  4.  History of what appears to be surgery for removal of goiter:  Check TSH, CMP  5.   Headache:  Likely due to #2, check CMP  6.  Flu vaccine

## 2016-01-26 NOTE — Patient Instructions (Addendum)
Do not put qtips in your ears!  Use Eucerin cream twice daily all over, especially after bathing.

## 2016-01-27 LAB — COMPREHENSIVE METABOLIC PANEL
ALBUMIN: 4.3 g/dL (ref 3.5–4.8)
ALT: 27 IU/L (ref 0–32)
AST: 29 IU/L (ref 0–40)
Albumin/Globulin Ratio: 1.4 (ref 1.1–2.5)
Alkaline Phosphatase: 114 IU/L (ref 39–117)
BUN / CREAT RATIO: 16 (ref 11–26)
BUN: 11 mg/dL (ref 8–27)
Bilirubin Total: 0.4 mg/dL (ref 0.0–1.2)
CHLORIDE: 100 mmol/L (ref 96–106)
CO2: 27 mmol/L (ref 18–29)
Calcium: 9.2 mg/dL (ref 8.7–10.3)
Creatinine, Ser: 0.7 mg/dL (ref 0.57–1.00)
GFR calc Af Amer: 102 mL/min/{1.73_m2} (ref >59)
GFR calc non Af Amer: 88 mL/min/{1.73_m2} (ref >59)
Globulin, Total: 3 g/dL (ref 1.5–4.5)
Glucose: 87 mg/dL (ref 65–99)
Potassium: 4.1 mmol/L (ref 3.5–5.2)
SODIUM: 140 mmol/L (ref 134–144)
TOTAL PROTEIN: 7.3 g/dL (ref 6.0–8.5)

## 2016-01-27 LAB — TSH: TSH: 7.31 u[IU]/mL — ABNORMAL HIGH (ref 0.450–4.500)

## 2016-01-29 LAB — SPECIMEN STATUS REPORT

## 2016-01-29 LAB — T4, FREE: Free T4: 0.78 ng/dL — ABNORMAL LOW (ref 0.82–1.77)

## 2016-01-30 MED ORDER — LEVOTHYROXINE SODIUM 25 MCG PO TABS: 25.0000 ug | ORAL_TABLET | Freq: Every day | ORAL | Status: DC

## 2016-01-30 NOTE — Addendum Note (Signed)
Addended by: Marcene DuosMULBERRY, Wylan Gentzler M on: 01/30/2016 08:17 AM   Modules accepted: Orders

## 2016-02-18 ENCOUNTER — Telehealth: Payer: Self-pay | Admitting: Internal Medicine

## 2016-02-18 MED ORDER — CETIRIZINE HCL 5 MG/5ML PO SYRP: ORAL_SOLUTION | ORAL | Status: DC

## 2016-02-18 NOTE — Telephone Encounter (Signed)
Call from [facility].  They have our two Tallie Mehs confused. We did learn in the conversation that they are unable to obtain liquid Fexofenadine. Voice order to prescribe Cetirizine liquid instead.

## 2016-03-01 ENCOUNTER — Telehealth: Payer: Self-pay | Admitting: Internal Medicine

## 2016-03-01 DIAGNOSIS — E038 Other specified hypothyroidism: Secondary | ICD-10-CM

## 2016-03-01 NOTE — Telephone Encounter (Signed)
Nurse Junie PanningJayne called regarding Rx allergic reaction for patient.  The Rx is "lezothyroxine" 00.025 mg.  Patient has stopped taking medicine as it  Cause swelling.  Please advise if there is something else patient can take.  Junie PanningJayne can be reached at [phone].  Also [name] is aware of the Rx reaction.

## 2016-03-09 MED ORDER — THYROID 15 MG PO TABS: 15.0000 mg | ORAL_TABLET | Freq: Every day | ORAL | Status: DC

## 2016-03-13 NOTE — Telephone Encounter (Signed)
Sabrina Garrison contacted and will relate all information to patient. Patient to come in to follow up on 03/22/16 at 3:00pm

## 2016-03-22 ENCOUNTER — Ambulatory Visit (INDEPENDENT_AMBULATORY_CARE_PROVIDER_SITE_OTHER): Payer: Medicaid Other | Admitting: Internal Medicine

## 2016-03-22 VITALS — BP 120/72 | HR 70 | Resp 17 | Ht <= 58 in | Wt 119.0 lb

## 2016-03-22 DIAGNOSIS — L309 Dermatitis, unspecified: Secondary | ICD-10-CM | POA: Diagnosis not present

## 2016-03-22 DIAGNOSIS — E039 Hypothyroidism, unspecified: Secondary | ICD-10-CM | POA: Insufficient documentation

## 2016-03-22 DIAGNOSIS — J3089 Other allergic rhinitis: Secondary | ICD-10-CM

## 2016-03-22 DIAGNOSIS — E890 Postprocedural hypothyroidism: Secondary | ICD-10-CM

## 2016-03-22 NOTE — Progress Notes (Signed)
   Subjective:    Patient ID: Sabrina Garrison, female    DOB: 12/25/1945, 70 y.o.   MRN: [id number]  HPI   1.  Hypothyroidism:  Had facial swelling, though not erythematous and pruritic rash on neck, arms and legs with levothyroxine.  I did not get to see this when occurred. States also developed pruritic rash when started Armour Thyroid on 03/09/2016. Becomes clear this rash is the same she had with allergies and responded to Cetirizine.  She states she stopped the CEtirizine each time she started a new thyroid replacement hormone. Patient did not want to take medications at same time, as I instructed her, just did not realize I wanted her to take at different times of day.  2.  Decreased hearing and Tinnitus:  Pt. Saw Dr. Jearld FentonByers, ENT back in March.  Was started on nasal corticosteroid.  Is not using regularly.  Discussed importance of using daily and she appears to understand.  She feels it helps if she uses.  Reportedly to have hearing retested when returns in May.  She was felt to have middle ear fluid reportedly.   Current outpatient prescriptions:  .  Clobetasol Prop Emollient Base 0.05 % emollient cream, Apply thin amount to affected areas twice daily as needed.  Do not apply to face, Disp: 60 g, Rfl: 3 .  fexofenadine (ALLEGRA) 30 MG/5ML suspension, 10 ml by mouth twice daily, Disp: 600 mL, Rfl: 6 .  fluticasone (FLONASE) 50 MCG/ACT nasal spray, Place 2 sprays into both nostrils daily., Disp: , Rfl:  .  cetirizine HCl (ZYRTEC) 5 MG/5ML SYRP, 10 ml by mouth once daily as needed for allergies, Disp: 300 mL, Rfl: 11 .  thyroid (ARMOUR) 15 MG tablet, Take 1 tablet (15 mg total) by mouth daily., Disp: 30 tablet, Rfl: 2   Allergies  Allergen Reactions  . Levothyroxine Swelling    Face, hands and feet.  Has had this twice--did not realize it was the same medication she had taken before with same side effect. However, stopped her Cetirizine when started Levothyroxine and the Cetirizine was controlling  her rash/allergies     Review of Systems     Objective:   Physical Exam  NAD Skin:  Rash on back of neck, with erythematous and thickened skin folds.  Small patches of similar rash with scabbing from scratching scattered on arms, antecubital fossae bilaterally, dorsum of hands. HEENT:  PERRL, eyes watery, TMs dull bilaterally, nasal mucosa swollen and boggy, posterior pharyngeal mild cobbling. Lungs:  CTA CV:  RRR without murmur or rub, radial pulses normal and equal.       Assessment & Plan:  1.  Hypothyroidism:  To continue with Armour Thyroid 15 mg daily.  Restart Cetirizine as below.  Check TSH in 4 more weeks.  2.  Seasonal Allergies/Eczema:  Cetirizine and Clobetasol cream as needed.

## 2016-03-28 ENCOUNTER — Encounter: Payer: Self-pay | Admitting: Internal Medicine

## 2016-03-29 ENCOUNTER — Other Ambulatory Visit: Payer: Medicaid Other

## 2016-04-05 ENCOUNTER — Ambulatory Visit: Payer: Medicaid Other | Admitting: Internal Medicine

## 2016-04-17 ENCOUNTER — Other Ambulatory Visit: Payer: Self-pay | Admitting: Internal Medicine

## 2016-04-17 ENCOUNTER — Other Ambulatory Visit: Payer: Medicaid Other

## 2016-04-17 DIAGNOSIS — E039 Hypothyroidism, unspecified: Secondary | ICD-10-CM

## 2016-04-21 LAB — SPECIMEN STATUS REPORT

## 2016-04-21 LAB — TSH: TSH: 8.4 u[IU]/mL — ABNORMAL HIGH (ref 0.450–4.500)

## 2016-04-26 ENCOUNTER — Encounter: Payer: Self-pay | Admitting: Internal Medicine

## 2016-04-26 ENCOUNTER — Ambulatory Visit (INDEPENDENT_AMBULATORY_CARE_PROVIDER_SITE_OTHER): Payer: Medicaid Other | Admitting: Internal Medicine

## 2016-04-26 VITALS — BP 100/58 | HR 73 | Resp 22 | Ht <= 58 in | Wt 123.0 lb

## 2016-04-26 DIAGNOSIS — E038 Other specified hypothyroidism: Secondary | ICD-10-CM

## 2016-04-26 DIAGNOSIS — R21 Rash and other nonspecific skin eruption: Secondary | ICD-10-CM

## 2016-04-26 DIAGNOSIS — J302 Other seasonal allergic rhinitis: Secondary | ICD-10-CM

## 2016-04-26 MED ORDER — THYROID 30 MG PO TABS: 15.0000 mg | ORAL_TABLET | Freq: Every day | ORAL | Status: DC

## 2016-04-26 NOTE — Progress Notes (Signed)
   Subjective:    Patient ID: Sabrina Garrison, female    DOB: 01/30/1946, 70 y.o.   MRN: [id number]  HPI   1. Pruritic Rash:  Started back on Fexofenadine after her last visit.  Thought she was taking Cetirizine, but looking back, looks like I switched her to Fexofenadine.  States her rash was better after 1 week, then developed a rash on her forearms and anterior/posterior neck she feels is the same as the rash she had felt due to environmental allergies.  Has had problems with the rash for years--seems to come on this time of year, but then also states ate pork and the rash came on.   Cannot say whether the rash has worsened or stayed the same since recurred.  Her previous rash to me looked like chronic little bumps coalesced on her outer upper arms and forearms, legs.  Has used unknown lotion on her rash.  Different from the prescription medication (Clobetasol).   She does state her itchy eyes, nose, throat, sneezing and cough is better.  2.  Hypothyroidism:  Felt she had swelling of her face last time due to Armour Thyroid, but no recurrence with restart of Armour Thyroid and Fexofenadine.  Has much more energy and appetite.  TSH, however, is higher than before.  3.  Ears/Tinnitus:  Has app[facility]. [facility].  She is using corticosteroid nasal spray.  She feels like it is helpful.  Tinnitus only slightly better.     Current outpatient prescriptions:  .  fexofenadine (ALLEGRA) 180 MG tablet, Take 180 mg by mouth daily., Disp: , Rfl:  .  fluticasone (FLONASE) 50 MCG/ACT nasal spray, Place 2 sprays into both nostrils daily., Disp: , Rfl:  .  thyroid (ARMOUR) 15 MG tablet, Take 1 tablet (15 mg total) by mouth daily., Disp: 30 tablet, Rfl: 2 .  cetirizine HCl (ZYRTEC) 5 MG/5ML SYRP, 10 ml by mouth once daily as needed for allergies (Patient not taking: Reported on 04/26/2016), Disp: 300 mL, Rfl: 11 .  Clobetasol Prop Emollient Base 0.05 % emollient cream, Apply thin amount to  affected areas twice daily as needed.  Do not apply to face (Patient not taking: Reported on 04/26/2016), Disp: 60 g, Rfl: 3   Allergies  Allergen Reactions  . Levothyroxine Swelling    Face, hands and feet.  Has had this twice--did not realize it was the same medication she had taken before with same side effect. However, stopped her Cetirizine when started Levothyroxine and the Cetirizine was controlling her rash/allergies         Review of Systems     Objective:   Physical Exam NAD HEENT:  PERRL, EOMI, conjunctivae without injection, TMs pearly gray and without shine and bulge, throat without injection. Neck;  Supple, without adenopathy Chest:  CTA CV: RRR without murmur or rub, radial pulses normal and equal Skin:  Cobbled, lichenified skin about entire neck with mild underlying erythema.       Assessment & Plan:  1.  Skin Rash:  Pt. Unwilling to use Clobetasol in past as she feels this makes her rash worse.  To continue Fexofenadine as well as treatment for allergies.  Derm referral  2.  Allergies:  As above.  Continue Flonase as well.  3.  Hypothyroidism:  Increase to 30 mg Armour thyroid and repeat TsH in 1 month

## 2016-05-24 ENCOUNTER — Other Ambulatory Visit: Payer: Medicaid Other

## 2016-05-24 ENCOUNTER — Telehealth: Payer: Self-pay | Admitting: Internal Medicine

## 2016-05-24 DIAGNOSIS — E038 Other specified hypothyroidism: Secondary | ICD-10-CM

## 2016-05-25 LAB — TSH: TSH: 4.38 u[IU]/mL (ref 0.450–4.500)

## 2016-05-26 NOTE — Telephone Encounter (Signed)
[name] says patient is taking 15mg  of Armour Thyroid. She has not seen the dermatologist yet. [name] confirmed the appointment for 05/31/16.

## 2016-05-29 NOTE — Telephone Encounter (Signed)
So, did you share with Sabrina Garrison that the pt. Is supposed to be taking the 30 mg?  It will not do any good to have her follow up if she has not made the change. Cancel the appt. On Wednesday unless she has other concerns and have her increase her dose to the 30 (which i sent in previously)  Until she picks up the new prescription, she can take 2 of the 15 mg tabs until they are gone, then switch over to 1 of the 30s.

## 2016-05-31 ENCOUNTER — Encounter: Payer: Self-pay | Admitting: Internal Medicine

## 2016-05-31 ENCOUNTER — Ambulatory Visit (INDEPENDENT_AMBULATORY_CARE_PROVIDER_SITE_OTHER): Payer: Medicaid Other | Admitting: Internal Medicine

## 2016-05-31 VITALS — BP 98/58 | HR 74 | Temp 98.1°F | Resp 20 | Ht <= 58 in | Wt 125.0 lb

## 2016-05-31 DIAGNOSIS — E039 Hypothyroidism, unspecified: Secondary | ICD-10-CM | POA: Diagnosis not present

## 2016-05-31 DIAGNOSIS — J302 Other seasonal allergic rhinitis: Secondary | ICD-10-CM

## 2016-05-31 DIAGNOSIS — Z1239 Encounter for other screening for malignant neoplasm of breast: Secondary | ICD-10-CM | POA: Diagnosis not present

## 2016-05-31 DIAGNOSIS — L309 Dermatitis, unspecified: Secondary | ICD-10-CM | POA: Diagnosis not present

## 2016-05-31 DIAGNOSIS — Z23 Encounter for immunization: Secondary | ICD-10-CM

## 2016-05-31 NOTE — Telephone Encounter (Signed)
Left message with [name] to call back about Yolanda BonineShaw Jerkins

## 2016-05-31 NOTE — Progress Notes (Signed)
   Subjective:    Patient ID: Sabrina Garrison, female    DOB: Apr 15, 1946, 70 y.o.   MRN: [id number]  HPI   1.  Hypothyroidism:  TSH down to 4.380, despite fact that Para Skeans states she is still just taking 15 mg of Armour Thyroid.  She did not tolerate Levothyroxine --feels she had a rash and refused to continue to take. She feels like she has a lot of energy now.    2.  Dental decay:  Went to dentist, placed on amoxicillin for 14 days.  Was unable to return last week as planned to have tooth pulled, but has an appt. Mid August to have this tooth out--right lower molar.    3.  Rash:  Much better with restart of Allegra 10 ml once daily.  Felt to be allergy related.    4.  Allergies:  As in #3.  States using the Fluticasone has been very helpful in drying up her nose and throat so she can breathe--especially at nighttime.   States has mold in home, but [name] states her home is very clean.  She refuses to use air conditioning as she does not like the noise.   Current outpatient prescriptions:  .  fexofenadine (ALLEGRA) 180 MG tablet, Take 60 mg by mouth daily. , Disp: , Rfl:  .  fluticasone (FLONASE) 50 MCG/ACT nasal spray, Place 2 sprays into both nostrils daily., Disp: , Rfl:  .  ibuprofen (ADVIL,MOTRIN) 800 MG tablet, Take 800 mg by mouth 3 (three) times daily with meals., Disp: , Rfl:  .  thyroid (ARMOUR) 30 MG tablet, Take 0.5 tablets (15 mg total) by mouth daily., Disp: 30 tablet, Rfl: 11 .  Clobetasol Prop Emollient Base 0.05 % emollient cream, Apply thin amount to affected areas twice daily as needed.  Do not apply to face (Patient not taking: Reported on 04/26/2016), Disp: 60 g, Rfl: 3   Allergies  Allergen Reactions  . Levothyroxine Swelling    Face, hands and feet.  Has had this twice--did not realize it was the same medication she had taken before with same side effect. However, stopped her Cetirizine when started Levothyroxine and the Cetirizine was controlling her rash/allergies    Immunization History  Administered Date(s) Administered  . Hepatitis B 09/04/2011, 04/03/2012, 10/22/2012  . Influenza, High Dose Seasonal PF 01/26/2016  . MMR 04/03/2012  . Pneumococcal Conjugate-13 05/31/2016  . Pneumococcal Polysaccharide-23 04/03/2012  . Td 09/04/2011, 10/22/2012  . Tdap 04/03/2012    Review of Systems     Objective:   Physical Exam NAD HEENT:  Tobacco stained teeth with significant wear on chewing/biting surfaces, No gingival swelling or erythema Neck:  No thyromegaly. Chest:  CTA CV:  RRR  Without murmur or rub, radial pulses normal Skin: without rash LE:  No edema       Assessment & Plan:  1.  Hypothyroidism: Doing well with ARmour Thyroid at 15 mg.  CPM.  Repeat TSH in 4 months.  2.  Allergies/Eczema:  Controlled on Allegra 60 mg once daily as well as Fluticasone nasal spray.  3.  HM:  Needs Pneumovax, mammogram scheduled.  Should get other immunizations, but will need to obtain through PHD.  Has never undergone CPE--schedule in 4 months, a couple of days after fasting labs.

## 2016-07-05 ENCOUNTER — Telehealth: Payer: Self-pay | Admitting: Internal Medicine

## 2016-07-05 MED ORDER — LORATADINE 5 MG/5ML PO SYRP: ORAL_SOLUTION | ORAL | 11 refills | Status: DC

## 2016-08-30 ENCOUNTER — Encounter: Payer: Self-pay | Admitting: Internal Medicine

## 2016-08-30 ENCOUNTER — Ambulatory Visit (INDEPENDENT_AMBULATORY_CARE_PROVIDER_SITE_OTHER): Payer: Medicaid Other | Admitting: Internal Medicine

## 2016-08-30 VITALS — BP 108/68 | HR 66 | Ht <= 58 in | Wt 123.0 lb

## 2016-08-30 DIAGNOSIS — H9313 Tinnitus, bilateral: Secondary | ICD-10-CM

## 2016-08-30 DIAGNOSIS — M65312 Trigger thumb, left thumb: Secondary | ICD-10-CM

## 2016-08-30 MED ORDER — LIDOCAINE HCL 1 % IJ SOLN
0.5000 mL | Freq: Once | INTRAMUSCULAR | Status: AC
  Administered 2016-08-30: 0.5 mL

## 2016-08-30 MED ORDER — METHYLPREDNISOLONE ACETATE 40 MG/ML IJ SUSP
20.0000 mg | Freq: Once | INTRAMUSCULAR | Status: AC
  Administered 2016-08-30: 20 mg

## 2016-08-30 NOTE — Patient Instructions (Signed)
Call if redness, increased swelling or increased pain or fever. Keep splint on all the time, except when bathing or really needs to wash hand and then rewrap

## 2016-08-30 NOTE — Progress Notes (Signed)
   Subjective:    Patient ID: Sabrina Garrison, female    DOB: 08/28/1946, 70 y.o.   MRN: [id number]  HPI   1.  Left thumb pain and swelling at PIP joint:  Has had for 2-3 months.  States her other thumb hurt at both the MCP joint and PIP joint, so was using the left hand more and started to hurt.  No history of injury.  No erythema.  Has not tried any medication for this joint.   No history of taking ibuprofen for her thumb  2.  Bilateral ears with sense of a "ball"  Moving back and forth depending on what ear is dependent when lying down or moving.  Makes a noise like movement or a siren.  Current Meds  Medication Sig  . fluticasone (FLONASE) 50 MCG/ACT nasal spray Place 2 sprays into both nostrils daily.  Marland Kitchen. loratadine (CLARITIN) 5 MG/5ML syrup 10 ml by mouth once daily  . thyroid (ARMOUR) 30 MG tablet Take 0.5 tablets (15 mg total) by mouth daily.      Review of Systems     Objective:   Physical Exam   HEENT:  Bilateral TMs thickened, yellowed, retracted, left more than right. Neck: Supple, no adenopathy  Left thumb with thickening of PIP and tender over the joint, but no effusion.  Thumb triggers with pulley at base of finger and this is what really causes her pain.  Obtained informed consent: Base of left thumb cleaned in sterile fashion.  0.5 ml 1% lidocaine and 0.5 ml or 20 mg Depo Medrol injected to the pulley area at palmar base of thumb.  Dressing applied and thumb splint applied.  Pt. Tolerated well.      Assessment & Plan:  1.  Ear complaints:  Patient was seen by Dr. Annalee GentaShoemaker and Audiologist, Hardie PulleyBeth Knott at [facility].  Was to get set up with hearing aides.  Will call office to find out what happened with this as her last visit was at the end of June.  2.  Left Thumb Trigger finger:  Injected pulley area with 20 mg Depo Medrol and 1/2 cc 1% Lidocaine.  Thumb splinted and told to keep splinted except when bathing for 2 weeks.  Follow up then. To call for signs of  infection

## 2016-09-13 ENCOUNTER — Other Ambulatory Visit: Payer: Self-pay | Admitting: Internal Medicine

## 2016-09-13 ENCOUNTER — Ambulatory Visit (INDEPENDENT_AMBULATORY_CARE_PROVIDER_SITE_OTHER): Payer: Medicaid Other | Admitting: Internal Medicine

## 2016-09-13 VITALS — BP 104/60 | HR 70 | Resp 17 | Ht <= 58 in | Wt 124.0 lb

## 2016-09-13 DIAGNOSIS — M65312 Trigger thumb, left thumb: Secondary | ICD-10-CM

## 2016-09-13 DIAGNOSIS — M1811 Unilateral primary osteoarthritis of first carpometacarpal joint, right hand: Secondary | ICD-10-CM

## 2016-09-13 MED ORDER — IBUPROFEN 200 MG PO TABS: ORAL_TABLET | ORAL | 0 refills | Status: DC

## 2016-09-13 NOTE — Progress Notes (Signed)
   Subjective:    Patient ID: Sabrina Garrison, female    DOB: 10/29/1946, 70 y.o.   MRN: [id number]  HPI   Left thumb trigger finger:  Only wore splint for 1 week and took off--grandchild pulled it off.   Thumb, however, is pain free and not triggering.  Right thumb now with pain at MCP, not triggering  Current Meds  Medication Sig  . fluticasone (FLONASE) 50 MCG/ACT nasal spray Place 2 sprays into both nostrils daily.  Marland Kitchen. loratadine (CLARITIN) 5 MG/5ML syrup 10 ml by mouth once daily  . thyroid (ARMOUR) 30 MG tablet Take 0.5 tablets (15 mg total) by mouth daily.   Allergies  Allergen Reactions  . Levothyroxine Swelling    Face, hands and feet.  Has had this twice--did not realize it was the same medication she had taken before with same side effect. However, stopped her Cetirizine when started Levothyroxine and the Cetirizine was controlling her rash/allergies      Review of Systems     Objective:   Physical Exam Left thumb:  Not triggering.  Swelling decreased. Right thumb:  No triggering.  Tender about MCP, but no synovial thickening or effusion, no erythema.  Full ROM       Assessment & Plan:  1.  Left thumb trigger finger:  Currently resolved.  No further treatment for now.  2.  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  Ibuprofen with food as needed.

## 2016-10-04 ENCOUNTER — Ambulatory Visit (INDEPENDENT_AMBULATORY_CARE_PROVIDER_SITE_OTHER): Payer: Medicaid Other | Admitting: Internal Medicine

## 2016-10-04 ENCOUNTER — Encounter: Payer: Self-pay | Admitting: Internal Medicine

## 2016-10-04 VITALS — BP 100/64 | HR 80 | Temp 98.0°F | Ht <= 58 in | Wt 124.5 lb

## 2016-10-04 DIAGNOSIS — Z0000 Encounter for general adult medical examination without abnormal findings: Secondary | ICD-10-CM

## 2016-10-04 DIAGNOSIS — Z13820 Encounter for screening for osteoporosis: Secondary | ICD-10-CM

## 2016-10-04 DIAGNOSIS — Z1231 Encounter for screening mammogram for malignant neoplasm of breast: Secondary | ICD-10-CM

## 2016-10-04 DIAGNOSIS — Z1239 Encounter for other screening for malignant neoplasm of breast: Secondary | ICD-10-CM

## 2016-10-04 NOTE — Progress Notes (Signed)
Subjective:    Patient ID: Sabrina Garrison, female    DOB: February 28, 1946, 70 y.o.   MRN: [id number]  HPI CPE without pap [name], CMA interpreting Karenni  1.  Pap:  Had one in past, 5 years ago in [facility].  Normal as far as she knows.  2.  Mammogram:  Has had once in [facility].  3.  Osteoprevention:  No dairy.  Physically active.  4.  Guaiac Cards:  Not the home 3 card screening.  5.  Colonoscopy:  She thinks she may have had this done her first year here.  6.  Immunizations:   Immunization History  Administered Date(s) Administered  . Hepatitis B 09/04/2011, 04/03/2012, 10/22/2012  . Influenza, High Dose Seasonal PF 01/26/2016  . Influenza-Unspecified 08/28/2016  . MMR 04/03/2012  . Pneumococcal Conjugate-13 05/31/2016  . Pneumococcal Polysaccharide-23 04/03/2012  . Td 09/04/2011, 10/22/2012  . Tdap 04/03/2012    Current Meds  Medication Sig  . fluticasone (FLONASE) 50 MCG/ACT nasal spray Place 2 sprays into both nostrils daily.  Marland Kitchen ibuprofen (ADVIL,MOTRIN) 400 MG tablet TAKE 1 TABLET BY MOUTH TWICE DAILY WITH A MEAL AS NEEDED FOR THUMB PAIN  . loratadine (CLARITIN) 5 MG/5ML syrup 10 ml by mouth once daily  . thyroid (ARMOUR) 30 MG tablet Take 0.5 tablets (15 mg total) by mouth daily.   Allergies  Allergen Reactions  . Levothyroxine Swelling    Face, hands and feet.  Has had this twice--did not realize it was the same medication she had taken before with same side effect. However, stopped her Cetirizine when started Levothyroxine and the Cetirizine was controlling her rash/allergies   Past Medical History:  Diagnosis Date  . Hypothyroidism 2017   Past Surgical History:  Procedure Laterality Date  . anterior neck surgery  1997   Probable goiter   History reviewed. No pertinent family history.   Social History   Social History  . Marital status: Widowed    Spouse name: Raw Reh  . Number of children: 11  . Years of education: 0   Occupational History  . retired     Social History Main Topics  . Smoking status: Never Smoker  . Smokeless tobacco: Current User    Types: Chew  . Alcohol use No  . Drug use: No  . Sexual activity: Not on file   Other Topics Concern  . Not on file   Social History Narrative   From [place] with 2 adult sons   Lost 3 children in past   Lived for 11 years in [place] in [place] to [place]. In 2012    Review of Systems  Constitutional: Negative for appetite change and fatigue.  HENT: Positive for dental problem and hearing loss. Negative for sore throat.        Went to [place] dentist  Has only been once.  Eyes: Positive for visual disturbance.       Poor vision  Respiratory: Negative for choking and shortness of breath.   Cardiovascular: Negative for chest pain, palpitations and leg swelling.  Gastrointestinal: Positive for constipation. Negative for blood in stool, diarrhea, nausea and vomiting.       No melena  Constipated sometimes  Genitourinary: Negative for decreased urine volume, dysuria, frequency, vaginal bleeding and vaginal discharge.  Skin: Negative for rash.  Neurological: Negative for dizziness, weakness and numbness.  Psychiatric/Behavioral: Negative for dysphoric mood. The patient is nervous/anxious.  Anxiety with  Many things. Has tried ESOL classes but cannot still ready--makes her anxious.       Objective:   Physical Exam  Constitutional: She is oriented to person, place, and time. She appears well-developed and well-nourished.  HENT:  Head: Normocephalic and atraumatic.  Right Ear: Tympanic membrane, external ear and ear canal normal.  Left Ear: Tympanic membrane, external ear and ear canal normal.  Nose: Nose normal.  Mouth/Throat: Oropharynx is clear and moist and mucous membranes are normal. Dental caries present.  Large openings in earlobes from inserts/stretching.  Eyes: Conjunctivae and EOM are normal. Pupils are equal, round, and  reactive to light.  Discs sharp bilaterally  Neck: Normal range of motion. Neck supple. No thyroid mass and no thyromegaly present.    Cardiovascular: Normal rate, regular rhythm, S1 normal and S2 normal.  Exam reveals no S3, no S4 and no friction rub.   No murmur heard. No carotid bruits.  Carotid, radial, femoral, DP and PT pulses normal and equal.   Pulmonary/Chest: Effort normal and breath sounds normal. Right breast exhibits no inverted nipple, no mass, no nipple discharge, no skin change and no tenderness. Left breast exhibits no inverted nipple, no mass, no nipple discharge, no skin change and no tenderness.  Abdominal: Soft. Bowel sounds are normal. She exhibits no mass. There is no hepatosplenomegaly. There is no tenderness. No hernia.  Genitourinary:  Genitourinary Comments: Deferred by patient  Musculoskeletal: Normal range of motion.  Lymphadenopathy:    She has no cervical adenopathy.  Neurological: She is alert and oriented to person, place, and time. She has normal strength and normal reflexes. No cranial nerve deficit or sensory deficit. Coordination and gait normal.  Skin: Skin is warm and dry. Rash noted.  Still with thickened, lichenified skin of arms, legs.  Psychiatric: She has a normal mood and affect. Her speech is normal and behavior is normal.          Assessment & Plan:  1.  CPE Schedule Mammogram DXA bone density Fasting labs in 2 weeks:  FLP, BMP, TSH Follow up in 6 months Send for records from [facility]  2.  Mixed hearing loss:  Still have not heard anything about hearing aides through [facility].  Will contact again.  3.  Tobacco abuse:  Chew.  Encouraged use of Nicotine gum instead.  Discussed at length with interpretation available.

## 2016-10-04 NOTE — Patient Instructions (Signed)
For Nicotine gum:  When you feel need for smoking:  Place nicotine gum in mouth and chew until juicy, then stick between gum and cheek.  You will absorb the nicotine from your mouth.   Do not aggressively chew gum. Spit gum out in garbage once you feel you are done with it.

## 2016-10-24 ENCOUNTER — Other Ambulatory Visit: Payer: Medicaid Other

## 2016-11-05 ENCOUNTER — Encounter: Payer: Self-pay | Admitting: Internal Medicine

## 2016-12-27 ENCOUNTER — Encounter: Payer: Self-pay | Admitting: Internal Medicine

## 2016-12-27 ENCOUNTER — Ambulatory Visit (INDEPENDENT_AMBULATORY_CARE_PROVIDER_SITE_OTHER): Payer: Medicaid Other | Admitting: Internal Medicine

## 2016-12-27 VITALS — BP 122/68 | HR 64 | Resp 12 | Ht <= 58 in | Wt 121.0 lb

## 2016-12-27 DIAGNOSIS — L309 Dermatitis, unspecified: Secondary | ICD-10-CM | POA: Diagnosis not present

## 2016-12-27 DIAGNOSIS — E039 Hypothyroidism, unspecified: Secondary | ICD-10-CM | POA: Diagnosis not present

## 2016-12-27 MED ORDER — CLOBETASOL PROP EMOLLIENT BASE 0.05 % EX CREA
TOPICAL_CREAM | CUTANEOUS | 3 refills | Status: DC
Start: 2016-12-27 — End: 2017-06-26

## 2016-12-27 MED ORDER — THYROID 30 MG PO TABS: 30.0000 mg | ORAL_TABLET | Freq: Every day | ORAL | 11 refills | Status: DC

## 2016-12-27 NOTE — Progress Notes (Signed)
   Subjective:    Patient ID: Sabrina Garrison, female    DOB: 10/07/1946, 71 y.o.   MRN: [id number]  HPI    1.  Hypothyroidism:  Doesn't feel well, tired.  Not clear how long she has been out of Armour Thyroid, but hasn't felt well for 1 month.  Thinks she has probably been out of hormone for same length of time.  Called pharmacy--sounds like was filling 15 mg Rx and not 30 mg.  Last pick up was 10/27/2016. Depressed as no one is visiting her from [place].  2.  Rash:  Out of Loratadine.  Has refills.  Not clear why she is not getting filled.  Also not getting Clobetasol filled as well.    Current Meds  Medication Sig  . calcium citrate-vitamin D (CITRACAL+D) 315-200 MG-UNIT tablet Take 1 tablet by mouth 2 (two) times daily.  . fluticasone (FLONASE) 50 MCG/ACT nasal spray Place 2 sprays into both nostrils daily.  . [DISCONTINUED] thyroid (NP THYROID) 15 MG tablet Take 15 mg by mouth daily.   Allergies  Allergen Reactions  . Levothyroxine Swelling    Face, hands and feet.  Has had this twice--did not realize it was the same medication she had taken before with same side effect. However, stopped her Cetirizine when started Levothyroxine and the Cetirizine was controlling her rash/allergies        Review of Systems     Objective:   Physical Exam Tearful initially. Skin is dry, scaly, lichenified all over, but most prominent in skin folds of posterior neck, elbows, breast area.       Assessment & Plan:  1.  Hypothyroidism:  Suspect some of her feeling down due to symptomatic hypothyroidism.  Long discussion regarding how to make sure she does not run out in future.  2.  Eczema:  Sent new prescriptions of Clobetasol.  To pick up Loratadine as well and restart.

## 2017-01-05 ENCOUNTER — Emergency Department (HOSPITAL_COMMUNITY)
Admission: EM | Admit: 2017-01-05 | Discharge: 2017-01-05 | Disposition: A | Discharge status: Home / Self Care | Payer: Medicaid Other | Attending: Emergency Medicine | Admitting: Emergency Medicine

## 2017-01-05 ENCOUNTER — Encounter (HOSPITAL_COMMUNITY): Payer: Self-pay | Admitting: *Deleted

## 2017-01-05 ENCOUNTER — Emergency Department (HOSPITAL_COMMUNITY): Payer: Medicaid Other

## 2017-01-05 DIAGNOSIS — Y929 Unspecified place or not applicable: Secondary | ICD-10-CM | POA: Insufficient documentation

## 2017-01-05 DIAGNOSIS — E039 Hypothyroidism, unspecified: Secondary | ICD-10-CM | POA: Insufficient documentation

## 2017-01-05 DIAGNOSIS — W228XXA Striking against or struck by other objects, initial encounter: Secondary | ICD-10-CM | POA: Insufficient documentation

## 2017-01-05 DIAGNOSIS — Y999 Unspecified external cause status: Secondary | ICD-10-CM | POA: Diagnosis not present

## 2017-01-05 DIAGNOSIS — Y939 Activity, unspecified: Secondary | ICD-10-CM | POA: Diagnosis not present

## 2017-01-05 DIAGNOSIS — S81811A Laceration without foreign body, right lower leg, initial encounter: Secondary | ICD-10-CM | POA: Insufficient documentation

## 2017-01-05 MED ORDER — LIDOCAINE HCL 2 % IJ SOLN
5.0000 mL | Freq: Once | INTRAMUSCULAR | Status: AC
  Administered 2017-01-05: 100 mg
  Filled 2017-01-05: qty 20

## 2017-01-05 MED ORDER — TETANUS-DIPHTH-ACELL PERTUSSIS 5-2.5-18.5 LF-MCG/0.5 IM SUSP
0.5000 mL | Freq: Once | INTRAMUSCULAR | Status: AC
  Administered 2017-01-05: 0.5 mL via INTRAMUSCULAR
  Filled 2017-01-05: qty 0.5

## 2017-01-05 NOTE — ED Notes (Signed)
Pt returns from xray

## 2017-01-05 NOTE — ED Notes (Signed)
Tried to contact interpretor through language line and no one was available for this language.

## 2017-01-05 NOTE — ED Triage Notes (Addendum)
PT cut her leg in her room today.  No active bleeding at this time.  Very difficult to understand d/t language barrier.  I believe the language is [place] but was unable to connect with a Nurse, learning disabilitytranslator.

## 2017-01-05 NOTE — ED Provider Notes (Signed)
[facility]-EMERGENCY DEPT Provider Note   CSN: [id number] Arrival date & time: 01/05/17  1703  By signing my name below, I, Sabrina Garrison, attest that this documentation has been prepared under the direction and in the presence of [name], NP. Electronically Signed: Alyssa Garrison, ED Scribe. 01/05/17. 6:33 PM.  History   Chief Complaint Chief Complaint  Patient presents with  . Extremity Laceration   The history is provided by the patient and a relative. A language interpreter was used.    HPI Comments: Sabrina Garrison is a 71 y.o. female who presents to the Emergency Department complaining of a laceration to the right lower leg obtained around 4 PM today. Pt tripped and struck her leg on the vacuum. Bleeding is controlled with pressure bandage. Pt is unaware of her last tetanus vaccination. She denies LOC.  Past Medical History:  Diagnosis Date  . Allergy   . Eczema   . Hypothyroidism 2017    Patient Active Problem List   Diagnosis Date Noted  . Hypothyroidism 03/22/2016  . Eczema 01/26/2016  . Hearing loss 01/26/2016  . 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 Seasonal allergies 01/26/2016    Past Surgical History:  Procedure Laterality Date  . anterior neck surgery  1997   Probable goiter    OB History    No data available       Home Medications    Prior to Admission medications   Medication Sig Start Date End Date Taking? Authorizing Provider  calcium citrate-vitamin D (CITRACAL+D) 315-200 MG-UNIT tablet Take 1 tablet by mouth 2 (two) times daily.    Historical Provider, MD  Clobetasol Prop Emollient Base 0.05 % emollient cream Apply thin amount to affected areas twice daily as needed.  Do not apply to face 12/27/16   Julieanne Manson, MD  fluticasone [facility]) 50 MCG/ACT nasal spray Place 2 sprays into both nostrils daily.    Historical Provider, MD  loratadine (CLARITIN) 5 MG/5ML syrup 10 ml by mouth once daily Patient not taking: Reported on 12/27/2016 07/05/16   Julieanne Manson, MD  thyroid [facility] THYROID) 30 MG tablet Take 1 tablet (30 mg total) by mouth daily before breakfast. 12/27/16   Julieanne Manson, MD    Family History No family history on file.  Social History Social History  Substance Use Topics  . Smoking status: Never Smoker  . Smokeless tobacco: Current User    Types: Chew  . Alcohol use No     Allergies   Levothyroxine   Review of Systems Review of Systems  Musculoskeletal: Positive for arthralgias (right lower leg).  Skin: Positive for color change and wound.       Right lower leg  Neurological: Negative for syncope and headaches.     Physical Exam Updated Vital Signs BP 107/68 (BP Location: Right Arm)   Pulse 77   Temp 98.6 F (37 C) (Oral)   Resp 16   Ht 4' 9.5" (1.461 m)   Wt 54.9 kg   BMI 25.73 kg/m   Physical Exam  Constitutional: She appears well-developed and well-nourished. She is active. No distress.  HENT:  Head: Normocephalic and atraumatic.  Eyes: EOM are normal.  Neck: Neck supple.  Cardiovascular: Normal rate.   dorsalis pedis pulse is 2+ Adequate circulation  Pulmonary/Chest: Effort normal.  Musculoskeletal:       Right lower leg: She exhibits tenderness, bony tenderness, swelling and laceration.  Pedal pulse 2+, adequate circulation.  There is a 3 cm laceration to the anterior  aspect of the right lower leg. There is tenderness and ecchymosis surrounding the wound.   Neurological: She is alert.  Skin: Skin is warm and dry.  3 cm laceration to the anterior aspect of the right lower leg The wound is gaping Ecchymosis to the right lower leg  Psychiatric: She has a normal mood and affect. Her behavior is normal.  Nursing note and vitals reviewed.  ED Treatments / Results  DIAGNOSTIC STUDIES:  COORDINATION OF CARE: 6:31 PM Discussed treatment plan with pt at bedside which includes Xray, Tetanus booster and laceration repair and pt agreed to plan.   Radiology Dg Tibia/fibula  Right  Result Date: 01/05/2017 CLINICAL DATA:  Fall, laceration. EXAM: RIGHT TIBIA AND FIBULA - 2 VIEW COMPARISON:  None. FINDINGS: No acute fracture deformity or dislocation. Joint spaces intact without erosions. No destructive bony lesions. Soft tissue planes are not suspicious. Pretibial phleboliths. IMPRESSION: Negative. Electronically Signed   By: Awilda Metroourtnay  Bloomer M.D.   On: 01/05/2017 19:18    Procedures .Marland Kitchen.Laceration Repair Date/Time: 01/05/2017 6:34 PM Performed by: Janne NapoleonNEESE, HOPE M Authorized by: Janne NapoleonNEESE, HOPE M   Consent:    Consent obtained:  Verbal   Consent given by:  Patient Laceration details:    Location:  Leg   Leg location:  R lower leg   Length (cm):  3 Pre-procedure details:    Preparation:  Imaging obtained to evaluate for foreign bodies Exploration:    Hemostasis achieved with:  Direct pressure   Wound extent: no underlying fracture noted     Contaminated: no   Treatment:    Area cleansed with:  Betadine   Amount of cleaning:  Standard   Irrigation solution:  Sterile saline   Irrigation method:  Syringe Skin repair:    Repair method:  Sutures   Suture size:  5-0   Suture material:  Prolene   Suture technique:  Simple interrupted   Number of sutures:  6 Approximation:    Approximation:  Close   Vermilion border: well-aligned   Post-procedure details:    Patient tolerance of procedure:  Tolerated well, no immediate complications Comments:     Trimmed the wound edges      Medications Ordered in ED Medications  lidocaine (XYLOCAINE) 2 % (with pres) injection 100 mg (100 mg Infiltration Given 01/05/17 2009)  Tdap (BOOSTRIX) injection 0.5 mL (0.5 mLs Intramuscular Given 01/05/17 2023)     Initial Impression / Assessment and Plan / ED Course  I have reviewed the triage vital signs and the nursing notes.  Pertinent imaging results that were available during my care of the patient were reviewed by me and considered in my medical decision making (see chart for  details).    I personally performed the services described in this documentation, which was scribed in my presence. The recorded information has been reviewed and is accurate.  Final Clinical Impressions(s) / ED Diagnoses  71 y.o. female with laceration of the right lower leg s/p injury stable for d/c without focal neuro deficits and normal x-ray. Instructions on wound care and return given via language line. Patient's family member here with her.  Final diagnoses:  Laceration of right lower extremity, initial encounter  Contusion right lower leg  New Prescriptions Discharge Medication List as of 01/05/2017  8:06 PM       Janne NapoleonHope M Neese, NP 01/06/17 1530    Maia PlanJoshua G Long, MD 01/07/17 1900

## 2017-01-15 ENCOUNTER — Ambulatory Visit: Payer: Medicaid Other | Admitting: Internal Medicine

## 2017-01-18 ENCOUNTER — Encounter: Payer: Self-pay | Admitting: Internal Medicine

## 2017-01-18 ENCOUNTER — Ambulatory Visit (INDEPENDENT_AMBULATORY_CARE_PROVIDER_SITE_OTHER): Payer: Medicaid Other | Admitting: Internal Medicine

## 2017-01-18 VITALS — BP 110/72 | HR 86 | Resp 12 | Ht <= 58 in | Wt 121.0 lb

## 2017-01-18 DIAGNOSIS — Z4802 Encounter for removal of sutures: Secondary | ICD-10-CM

## 2017-01-18 NOTE — Progress Notes (Signed)
   Subjective:    Patient ID: Sabrina Garrison, female    DOB: 09/02/1946, 71 y.o.   MRN: [id number]  HPI  Here for suture removal.  Cut her right low leg 01/05/2017 and was seen in ED for repair.  Doing well with laceration.  No discharge.  Is having some itching near the site where her eczema is flaring.     Review of Systems     Objective:   Physical Exam  Right lateral high ankle with well healed curving laceration.  No erythema, or discharge, but mild swelling above her compression bandage which has slid down below the wound. 6 simple interrupted sutures removed easily.  Wound edges remained well opposed. Dressing with Bacitracin applied       Assessment & Plan:  1.  Right leg wound suture removal.  Tolerated well.  Discussed to return if signs of infection with Sabrina Garrison, CHW interpreting. Discussed keeping her leg elevated when sitting to prevent dehiscence of wound with mild edema from previous dressing.

## 2017-02-02 ENCOUNTER — Telehealth: Payer: Self-pay | Admitting: Internal Medicine

## 2017-02-03 MED ORDER — MUPIROCIN 2 % EX OINT
TOPICAL_OINTMENT | CUTANEOUS | 0 refills | Status: DC
Start: 2017-02-03 — End: 2017-03-19

## 2017-02-21 ENCOUNTER — Other Ambulatory Visit (INDEPENDENT_AMBULATORY_CARE_PROVIDER_SITE_OTHER): Payer: Self-pay | Admitting: Licensed Clinical Social Worker

## 2017-02-21 DIAGNOSIS — F439 Reaction to severe stress, unspecified: Secondary | ICD-10-CM
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
## 2017-03-09 ENCOUNTER — Other Ambulatory Visit: Payer: Medicaid Other | Admitting: Licensed Clinical Social Worker

## 2017-03-14 ENCOUNTER — Other Ambulatory Visit: Payer: Medicaid Other | Admitting: Licensed Clinical Social Worker

## 2017-03-19 ENCOUNTER — Encounter: Payer: Self-pay | Admitting: Internal Medicine

## 2017-03-19 ENCOUNTER — Ambulatory Visit (INDEPENDENT_AMBULATORY_CARE_PROVIDER_SITE_OTHER): Payer: Medicaid Other | Admitting: Internal Medicine

## 2017-03-19 VITALS — BP 102/62 | HR 86 | Resp 18 | Ht <= 58 in | Wt 125.0 lb

## 2017-03-19 DIAGNOSIS — B86 Scabies: Secondary | ICD-10-CM

## 2017-03-19 DIAGNOSIS — L309 Dermatitis, unspecified: Secondary | ICD-10-CM | POA: Diagnosis not present

## 2017-03-19 MED ORDER — PERMETHRIN 5 % EX CREA: 1.0000 "application " | TOPICAL_CREAM | Freq: Once | CUTANEOUS | 1 refills | Status: AC

## 2017-03-19 NOTE — Patient Instructions (Signed)
The day you pick up the Elimite, take a shower, then apply cream from head to toe and go to bed,  The next day, strip bed, wipe down sofa/chairs.  Take all clothes and sheets, blankets and wash in hot water dry in hot dryer.   Then wash off elimite cream  Repeat process in 1 week. Use Clobetasol cream twice weekly on itchy spots.   Use Claritin 10 mg daily Use Eucerin Cream with eczema relief twice daily--all over.

## 2017-03-19 NOTE — Progress Notes (Signed)
   Subjective:    Patient ID: Sabrina Garrison, female    DOB: Sep 29, 1946, 71 y.o.   MRN: [id number]  HPI   Ate and orange with peel and all as her son told her it was healthier.  This occurred 4 days ago. a pruritic rash started on her low lateral pretibial area of right leg.  Now rash is all over.  States she has had the rash for about 3 weeks, but worse since eating the orange on Friday 4 days ago.   Is using Clobetasol cream twice daily after bath or shower.  Eucerin Cream with menthol only occasionally.   Lives with 2 sons, neither with the rash. None of the younger children live with her any longer.   Points to her abdomen as places where it looks like early lesions.    Current Meds  Medication Sig  . Clobetasol Prop Emollient Base 0.05 % emollient cream Apply thin amount to affected areas twice daily as needed.  Do not apply to face  . fluticasone (FLONASE) 50 MCG/ACT nasal spray Place 2 sprays into both nostrils daily.  Marland Kitchen loratadine (CLARITIN) 5 MG/5ML syrup 10 ml by mouth once daily  . thyroid (ARMOUR THYROID) 30 MG tablet Take 1 tablet (30 mg total) by mouth daily before breakfast.    Allergies  Allergen Reactions  . Levothyroxine Swelling    Face, hands and feet.  Has had this twice--did not realize it was the same medication she had taken before with same side effect. However, stopped her Cetirizine when started Levothyroxine and the Cetirizine was controlling her rash/allergies    Review of Systems     Objective:   Physical Exam  NAD, except starts scratching all over Skin generally dry with lichenification at wrists.   Has elevated flat confluent lesions that are sort of violaceous, but not brightly erythematous at lateral lower leg, low back, upper back and scattered on arms. Papular red lesions on abdomen, perhaps with some burrows Larger papular red lesions on extensor surfaces of elbows with fissuring in between. Small red papular lesions on palmar hands.  Nothing  interdigital.         Assessment & Plan:  ?Scabies and eczema:  Continue present management of eczema, but treat twice, one week apart for scabies with 5% Permethrin cream. Follow up in 2 weeks to see if improves Sabrina Garrison to check with Sabrina Garrison's grandchildren and mother Sabrina Garrison to see if they have any rashes.  Granddaughter who accompanies her today denies having rash.

## 2017-03-21 ENCOUNTER — Other Ambulatory Visit (INDEPENDENT_AMBULATORY_CARE_PROVIDER_SITE_OTHER): Payer: Self-pay | Admitting: Licensed Clinical Social Worker

## 2017-03-21 DIAGNOSIS — F439 Reaction to severe stress, unspecified: Secondary | ICD-10-CM
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
## 2017-03-28 ENCOUNTER — Other Ambulatory Visit: Payer: Medicaid Other | Admitting: Licensed Clinical Social Worker

## 2017-04-02 ENCOUNTER — Ambulatory Visit: Payer: Medicaid Other | Admitting: Internal Medicine

## 2017-04-04 ENCOUNTER — Encounter: Payer: Self-pay | Admitting: Internal Medicine

## 2017-04-04 ENCOUNTER — Ambulatory Visit (INDEPENDENT_AMBULATORY_CARE_PROVIDER_SITE_OTHER): Payer: Medicaid Other | Admitting: Internal Medicine

## 2017-04-04 ENCOUNTER — Other Ambulatory Visit (INDEPENDENT_AMBULATORY_CARE_PROVIDER_SITE_OTHER): Payer: Self-pay | Admitting: Licensed Clinical Social Worker

## 2017-04-04 VITALS — BP 122/68 | HR 82 | Resp 12 | Ht <= 58 in | Wt 120.0 lb

## 2017-04-04 DIAGNOSIS — F4310 Post-traumatic stress disorder, unspecified: Secondary | ICD-10-CM

## 2017-04-04 DIAGNOSIS — F819 Developmental disorder of scholastic skills, unspecified: Secondary | ICD-10-CM

## 2017-04-04 NOTE — Progress Notes (Signed)
   Subjective:    Patient ID: Sabrina Garrison, female    DOB: 09/10/1946, 71 y.o.   MRN: [id number]  HPI   Spoke with Sabrina SimmerNatosha Knight, LCSW earlier today, who confirmed PTSD diagnosis for Sabrina Garrison.    Son, Sabrina Garrison, and family [place].  Sabrina Garrison and Sabrina Garrison, her two sons will continue to live with her.  Sabrina Garrison drinks 10 beers on a weekend.  Her other son  Smokes.  Denies concern for violence when her son drinks.  States tried ESOL classes, but states just could not remember the words.  Would forget them as soon as left class. Never learned to read or write in her language.  Never went to school.   Was quite young when married.  Thinks she was 71 years old when had her first child.  Current Meds  Medication Sig  . Clobetasol Prop Emollient Base 0.05 % emollient cream Apply thin amount to affected areas twice daily as needed.  Do not apply to face  . fluticasone (FLONASE) 50 MCG/ACT nasal spray Place 2 sprays into both nostrils daily.  Marland Kitchen. loratadine (CLARITIN) 5 MG/5ML syrup 10 ml by mouth once daily  . thyroid (ARMOUR THYROID) 30 MG tablet Take 1 tablet (30 mg total) by mouth daily before breakfast.   Allergies  Allergen Reactions  . Levothyroxine Swelling    Face, hands and feet.  Has had this twice--did not realize it was the same medication she had taken before with same side effect. However, stopped her Cetirizine when started Levothyroxine and the Cetirizine was controlling her rash/allergies        Review of Systems     Objective:   Physical Exam   NAD Lungs:  CTA CV:  RRR without murmur or rub, radial and DP         Assessment & Plan:  1.  PTSD:  Reported significant trauma in life before coming to [place]  2.  Learning Disability:  MMSE difficult to adequately score as patient unable to read or write even in her own language.  Has attempted ESOL classes without success.  She will not be able to learn the concepts or the [place] language  necessary for citizenship testing. [facility] process from Sabrina Garrison. Knight, LCSW
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
## 2017-06-26 ENCOUNTER — Ambulatory Visit (INDEPENDENT_AMBULATORY_CARE_PROVIDER_SITE_OTHER): Payer: Medicaid Other | Admitting: Internal Medicine

## 2017-06-26 ENCOUNTER — Encounter: Payer: Self-pay | Admitting: Internal Medicine

## 2017-06-26 VITALS — BP 118/70 | HR 80 | Resp 12 | Ht <= 58 in | Wt 118.0 lb

## 2017-06-26 DIAGNOSIS — L0889 Other specified local infections of the skin and subcutaneous tissue: Secondary | ICD-10-CM

## 2017-06-26 DIAGNOSIS — L309 Dermatitis, unspecified: Secondary | ICD-10-CM

## 2017-06-26 MED ORDER — MUPIROCIN CALCIUM 2 % EX CREA: 1.0000 "application " | TOPICAL_CREAM | Freq: Two times a day (BID) | CUTANEOUS | 0 refills | Status: DC

## 2017-06-26 MED ORDER — CLOBETASOL PROP EMOLLIENT BASE 0.05 % EX CREA
TOPICAL_CREAM | CUTANEOUS | 3 refills | Status: DC
Start: 2017-06-26 — End: 2018-04-19

## 2017-06-26 NOTE — Progress Notes (Signed)
   Subjective:    Patient ID: Sabrina Garrison, female    DOB: 03/02/1946, 71 y.o.   MRN: [id number]  HPI  Rash:  Still with rash.  Has used Clobetasol for some time now and did help, but still there.  Not clear how regularly she used the Clobetasol.  Last use was over 1 week ago.  States the rash was gone when she used more regularly, but worse when stops.   She may have used twice daily for 1 month, but not for a while.   Is taking Claritin daily as well.   The rash is very pruritic.    Also, would like to get started on citizenship--[facility].  Current Meds  Medication Sig  . calcium citrate-vitamin D (CITRACAL+D) 315-200 MG-UNIT tablet Take 1 tablet by mouth 2 (two) times daily.  . Clobetasol Prop Emollient Base 0.05 % emollient cream Apply thin amount to affected areas twice daily as needed.  Do not apply to face  . fluticasone (FLONASE) 50 MCG/ACT nasal spray Place 2 sprays into both nostrils daily.  Marland Kitchen. loratadine (CLARITIN) 5 MG/5ML syrup 10 ml by mouth once daily  . thyroid (ARMOUR THYROID) 30 MG tablet Take 1 tablet (30 mg total) by mouth daily before breakfast.  . [DISCONTINUED] Clobetasol Prop Emollient Base 0.05 % emollient cream Apply thin amount to affected areas twice daily as needed.  Do not apply to face   Review of Systems     Objective:   Physical Exam  NAD Arms with edematous looking rash--areas raised with mild erythema.  No fissuring.  No increased warmth.  Mainly involves forearms.   Has fissuring near wrists and dorsal hands, large patch on right lateral leg with significant flaking and fissuring.        Assessment & Plan:  Eczema:  Discussed at length that this is a chronic condition requiring regular treatment.  To continue with Loratadine.  Refilled Clobetasol and will also treat for a week with Bactroban as appears to have mild infection involving some patches. Encouraged her to use Clobetasol only on patches, her moisturizing cream  all over.  May stop Clobetasol when patches controlled and use sparingly as rash returns to control. [name] to go over meds with her to make sure she understands use.

## 2017-07-04 ENCOUNTER — Encounter: Payer: Self-pay | Admitting: Internal Medicine

## 2017-07-04 ENCOUNTER — Other Ambulatory Visit: Payer: Medicaid Other | Admitting: Internal Medicine

## 2017-07-04 DIAGNOSIS — L309 Dermatitis, unspecified: Secondary | ICD-10-CM

## 2017-07-04 NOTE — Progress Notes (Signed)
Unable to find patient--apparently has been moved for repairs to her current home.   Calls into Sabrina Garrison

## 2017-07-07 ENCOUNTER — Other Ambulatory Visit: Payer: Self-pay | Admitting: Internal Medicine

## 2017-08-01 ENCOUNTER — Other Ambulatory Visit (INDEPENDENT_AMBULATORY_CARE_PROVIDER_SITE_OTHER): Payer: Medicaid Other | Admitting: Internal Medicine

## 2017-08-01 DIAGNOSIS — L309 Dermatitis, unspecified: Secondary | ICD-10-CM
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
## 2017-08-06 ENCOUNTER — Encounter: Payer: Self-pay | Admitting: Internal Medicine

## 2017-08-06 DIAGNOSIS — R4189 Other symptoms and signs involving cognitive functions and awareness: Secondary | ICD-10-CM

## 2017-08-06 HISTORY — DX: Other symptoms and signs involving cognitive functions and awareness: R41.89

## 2017-08-30 ENCOUNTER — Other Ambulatory Visit (INDEPENDENT_AMBULATORY_CARE_PROVIDER_SITE_OTHER): Payer: Medicaid Other | Admitting: Licensed Clinical Social Worker

## 2017-08-30 DIAGNOSIS — Z603 Acculturation difficulty: Secondary | ICD-10-CM

## 2017-08-30 NOTE — Progress Notes (Signed)
LCSW and Social Work Intern visited with Sabrina Garrison and the interpreter Sabrina Garrison and the home of Sabrina Garrison's son and daughter in law where Sabrina Garrison is now residing. Sabrina Garrison shared many memories from before entering the [place] about the challenges of daily living and resources. She stated that she was no longer experiencing feelings of depression and that she was grateful to have the opportunity to live in the [place]. She also noted that she wishes she could have grown up here and is happy that her children and grandchildren get to make a life here. She expressed sincere gratitude for the help and guidance she has received and reinforced that the LCSW and SW Intern are always welcome and desired for a visit. LCSW went over citizenship papers and had Sabrina Garrison sign documentation on the needed form N-648 to qualify Sabrina Garrison for medical exemption from the written citizenship exam. No further needs were expressed at this time aside from desired companionship and wishes for continual check-in to assess needs that may arise. SW Intern proposed offering some personal yoga classes with Sabrina Garrison in the future. A follow-up home visit with SW Intern is schedule for Wednesday 10/17 at 10am.

## 2017-09-12 ENCOUNTER — Other Ambulatory Visit (INDEPENDENT_AMBULATORY_CARE_PROVIDER_SITE_OTHER): Payer: Self-pay | Admitting: Licensed Clinical Social Worker

## 2017-09-12 DIAGNOSIS — F439 Reaction to severe stress, unspecified: Secondary | ICD-10-CM

## 2017-09-12 NOTE — Progress Notes (Signed)
Social Work Intern visited with Sabrina Garrison and the interpreter Sabrina Garrison to assess for any resource or mental health needs at this time. Sabrina Garrison presented in a positive mood and appropriate affect. She was grateful to be visited and for the SW Intern's presence. She stated that she has been feeling frustrated about her lack of [place] skills and her inability to communicate with [place] speakers. She said that her thoughts at times can be overwhelming and she feels at her age, it is too much of a burden to have to learn so much new information, although she wishes she could learn the language. The SW Intern and Sabrina Garrison set an attainable goal together for Sabrina Garrison to learn one English word per visit. SW Intern provided follow-up information about Sabrina Garrison's citizenship papers with [employer] and let her know that there are no available appointments to move the process forward until February 2019. Sabrina Garrison also noted that her Eczema has been bothering her more significantly and that the cost for cream for her Eczema had gone up in the past two months and she is looking for alternative options. She also has noticed that when she eats pork, her eczema flares up. She desires to not eat pork but stated that at times she does because she cannot read the English label describing what the food contains. SW Work Intern will follow up next visit with more information around who does grocery shopping and how to label pork products so that Sabrina Garrison will not consume. SW Intern will also assess how often Sabrina Garrison is receiving and taking her prescribed medications and what barriers, if any, are limiting her access to needed medications. A follow up home visit is scheduled for 10/31 at 10am.

## 2017-09-23 ENCOUNTER — Encounter: Payer: Self-pay | Admitting: Internal Medicine

## 2017-09-23 ENCOUNTER — Other Ambulatory Visit: Payer: Self-pay | Admitting: Internal Medicine

## 2017-09-23 DIAGNOSIS — L309 Dermatitis, unspecified: Secondary | ICD-10-CM

## 2017-09-23 MED ORDER — BETAMETHASONE DIPROPIONATE 0.05 % EX OINT: TOPICAL_OINTMENT | CUTANEOUS | 4 refills | Status: DC

## 2017-09-23 NOTE — Progress Notes (Signed)
   Subjective:    Patient ID: Sabrina BonineShaw Winther, female    DOB: 07/08/1946, 71 y.o.   MRN: [id number]  HPI   Home visit today to see what medication she was actually using.  Is using Dove soap for bathing.  Eucerin for Eczema relief and Clobetasol emollient.  She does have her liquid Loratadine 10 mg daily she is taking as well. Feels her skin is doing well. The [place] has now [place] [address] in a house and really enjoy the home.  No mildew, roaches, etc with which to deal.  Current Meds  Medication Sig  . calcium citrate-vitamin D (CITRACAL+D) 315-200 MG-UNIT tablet Take 1 tablet by mouth 2 (two) times daily.  . Clobetasol Prop Emollient Base 0.05 % emollient cream Apply thin amount to affected areas twice daily as needed.  Do not apply to face  . LORATADINE CHILDRENS 5 MG/5ML syrup TAKE 10 MLS BY MOUTH EVERY DAY  . thyroid (ARMOUR THYROID) 30 MG tablet Take 1 tablet (30 mg total) by mouth daily before breakfast.    Allergies  Allergen Reactions  . Levothyroxine Swelling    Face, hands and feet.  Has had this twice--did not realize it was the same medication she had taken before with same side effect. However, stopped her Cetirizine when started Levothyroxine and the Cetirizine was controlling her rash/allergies       Review of Systems     Objective:   Physical Exam Some remaining thickening of skin on lateral arms/foreams.  Otherwise skin appears clear of eczema.       Assessment & Plan:  Eczema:  Doing well.  CPM.  Encouraged her to stay on Loratadine, use the Clobetasol when needed.  Dove Soap and Eucerin cream to be used regularly.

## 2017-09-23 NOTE — Progress Notes (Unsigned)
[facility], Loretha BrasilJayne Lutz, that patient needs refill of her cortisone cream.  They did not know the name.  States it was expensive last time.  Though we had received a request from pharmacy that betamethasone cream better covered than Clobetasol, but unable to find in chart.  [facility] back [facility].  She actually has 2 refills left of the Clobetasol and the Betamethasone requires a prior auth.   Told to cancel the Betamethasone prescription and will have the family notified of more refills.

## 2017-09-25 NOTE — Progress Notes (Signed)
Spoke with [name] informed to let Toba Banh know to continue clobetasol since it is only $3. [name] stated she would inform Yolanda BonineShaw Cobin and Junie PanningJayne.

## 2017-09-26 ENCOUNTER — Ambulatory Visit (INDEPENDENT_AMBULATORY_CARE_PROVIDER_SITE_OTHER): Payer: Self-pay | Admitting: Licensed Clinical Social Worker

## 2017-09-26 DIAGNOSIS — F439 Reaction to severe stress, unspecified: Secondary | ICD-10-CM

## 2017-09-26 NOTE — Progress Notes (Signed)
Social Work Intern visited with Yolanda BonineShaw Croy and the interpreter [name] to check in on any case management needs. Clelia CroftShaw Wysocki presented in a positive mood and appropriate affect. She began by sharing that she is experiencing distress that her family has misplaced her I94 form from entering the [place] from a previous move. She has a meeting scheduled with [employer] in November regarding citizenship and is worried about needing that form. SW Intern will follow up with [name] to assess the importance of having a tangible I94 form for the meeting and plan for replacement if needed. Clelia CroftShaw Kalka also stated that there has been confusion with her children picking up her prescription medications from the pharmacy for her eczema and that they have been purchasing over the counter medications at times instead of the prescription. SW Intern suggested that [employer] Mo plan to go with family next time to the pharmacy to model process of picking up prescription in hopes to avoid future confusion and help Clelia CroftShaw Keena with proper relief from her eczema symptoms. Yolanda BonineShaw Bartnik also stated that there is some confusion around whether or not they have taken proper steps to receive food stamps and SW Intern will follow up with DSS about this. SW Intern also assessed the grocery shopping and food planning of the family and suggested labeling foods in the cabinet that contain pork that seem to make the eczema flare up. SW Intern was assured that the daughter in-law is aware not to purchase pork and that this process is underway. Because of Clelia CroftShaw Mukai's previous disclosure of her frustration around language barriers, the SW Intern helped Yolanda BonineShaw Lineman practice writing her name and learning how to say a few letters and words in [place]. Clelia CroftShaw Weiss was encouraged by this process and stated that it made her "feel happy." She shared that she wants to continue to be visited by someone from [employer] "until she dies" because she is so appreciative of the support she receives  that she does not have access to elsewhere.  A follow-up home visit is schedule for 11/14 at 10am.

## 2017-09-27 NOTE — Addendum Note (Signed)
Addended by: Nilda SimmerKNIGHT, Jens Siems on: 09/27/2017 04:33 PM   Modules accepted: Level of Service

## 2017-10-03 ENCOUNTER — Encounter: Payer: Self-pay | Admitting: Internal Medicine

## 2017-10-03 MED ORDER — PREDNISONE 20 MG PO TABS: 20.0000 mg | ORAL_TABLET | Freq: Every day | ORAL | 0 refills | Status: DC

## 2017-10-03 NOTE — Patient Instructions (Signed)
Patient to take Prednisone 20 mg daily for 7 days for eczema worsened by what appears to be Pityriasis rosea. Encouraged her through interpretation with [name] to stay cool or the rash and itching would worsen. She also may start Clobetasol for her eczematous rash.

## 2017-10-10 ENCOUNTER — Ambulatory Visit: Payer: Self-pay | Admitting: Licensed Clinical Social Worker

## 2017-10-10 DIAGNOSIS — F439 Reaction to severe stress, unspecified: Secondary | ICD-10-CM

## 2017-10-11 NOTE — Progress Notes (Signed)
Social Work Intern met with Madolin Urick and the Interpreter [name] to follow-up from previous meetings and check in for any case management needs. Prakriti Standen presented in a positive mood and appropriate affect. The Social Work Intern made previous contact with [employer] to ask about the need for the I-94 form per Roniesha Kvamme's request and [employer] let her know that the misplacement of the I-94 form wasn't going to be an issue for their meeting. The Social work Intern had passed this information to [name] via phone and today assessed how the meeting went with [employer] on Nov 7th. Aminta Mino and [name] stated that the meeting went well and they were waiting on instruction from [employer] for next steps. A follow-up regarding any changes to food stamps was assessed and it seems there is no concern with changes at this time. Lilliane Tom showed the Social Work Intern that her eczema was dramatically better from their last meeting and that the prescribed medications had been helping. The family was continuing to try and better organize picking up her medications and Imara Varnum believed all of her prescriptions had been picked up at this time. Terressa Kitchen stated that she has been dealing with some stiffness in her head and neck and the Social Work Intern guided Zariah Torregrossa through a few standing yoga poses to aid with this. Aviona Inch and the Intern also worked together to learn a few new words in English and Amela Merica also shared a few words in [place] for Social Work Intern to learn. Ariya Mcelhinny was greatly appreciative of the visit and a follow-up visit has been scheduled for 11/07/17 at 10am.  

## 2017-11-07 ENCOUNTER — Other Ambulatory Visit: Payer: Self-pay | Admitting: Licensed Clinical Social Worker

## 2017-12-05 ENCOUNTER — Ambulatory Visit (INDEPENDENT_AMBULATORY_CARE_PROVIDER_SITE_OTHER): Payer: Medicaid Other | Admitting: Internal Medicine

## 2017-12-05 ENCOUNTER — Encounter: Payer: Self-pay | Admitting: Internal Medicine

## 2017-12-05 VITALS — BP 122/78 | HR 72 | Resp 12 | Ht <= 58 in | Wt 120.0 lb

## 2017-12-05 DIAGNOSIS — K047 Periapical abscess without sinus: Secondary | ICD-10-CM

## 2017-12-05 DIAGNOSIS — B372 Candidiasis of skin and nail: Secondary | ICD-10-CM

## 2017-12-05 MED ORDER — PENICILLIN V POTASSIUM 250 MG PO TABS: ORAL_TABLET | ORAL | 0 refills | Status: DC

## 2017-12-05 MED ORDER — BUTENAFINE HCL 1 % EX CREA: TOPICAL_CREAM | CUTANEOUS | 0 refills | Status: AC

## 2017-12-05 NOTE — Progress Notes (Signed)
   Subjective:    Patient ID: Sabrina Garrison, female    DOB: 11/13/1946, 72 y.o.   MRN: [id number]  HPI   Here with granddaughter and family.  Brings up she is having tooth pain and would like to be seen.  1.  Left lower dental pain.  She still chews Betel nuts.  Has had this for 1 month.  No fever.  Has not taken anything for the pain.  Cannot say for how long she has had the swelling about the jaw.  2.  Itching and burning under breasts.  Not the same rash she had some time ago--appeared to be pityriasis rosea, more severe with underlying eczema.  Current Meds  Medication Sig  . Clobetasol Prop Emollient Base 0.05 % emollient cream Apply thin amount to affected areas twice daily as needed.  Do not apply to face  . fluticasone (FLONASE) 50 MCG/ACT nasal spray Place 2 sprays into both nostrils daily.  Marland Kitchen. LORATADINE CHILDRENS 5 MG/5ML syrup TAKE 10 MLS BY MOUTH EVERY DAY  . [DISCONTINUED] thyroid (ARMOUR THYROID) 30 MG tablet Take 1 tablet (30 mg total) by mouth daily before breakfast.    Allergies  Allergen Reactions  . Levothyroxine Swelling    Face, hands and feet.  Has had this twice--did not realize it was the same medication she had taken before with same side effect. However, stopped her Cetirizine when started Levothyroxine and the Cetirizine was controlling her rash/allergies        Review of Systems     Objective:   Physical Exam  NAD HEENT:  Significant dental decay diffusely.  Tender when press on posterior molars on left.  No surrounding swelling or redness of gingivae.  Mild swelling externally of left lower face.  Throat without injection Neck:  Supple, No adenopathy Chest:  CTA.  Redness with wetness under breasts bilaterally CV:  RRR without murmur or rub, radial pulses normal and equal.         Assessment & Plan:  1.  Dental pain:  Penicillin VK 250 mg 4 times daily for 7 days.  Gave dental list for Medicaid.  2.  Monilial dermatitis:  Butenafine 1% cream  twice daily for 14 days.  Do not apply cortisone cream here

## 2017-12-12 ENCOUNTER — Ambulatory Visit: Payer: Medicaid Other | Admitting: Internal Medicine

## 2017-12-14 ENCOUNTER — Ambulatory Visit: Payer: Medicaid Other | Admitting: Licensed Clinical Social Worker

## 2017-12-14 DIAGNOSIS — Z603 Acculturation difficulty: Secondary | ICD-10-CM

## 2017-12-14 DIAGNOSIS — F439 Reaction to severe stress, unspecified: Secondary | ICD-10-CM

## 2017-12-14 NOTE — Progress Notes (Signed)
SW Intern met with Sabrina Garrison and Sabrina Garrison to catch up and assess Sabrina Garrison's functioning over the past few months. Sabrina Garrison presented in a depressed mood and appropriate affect. She quickly became tearful as she started disclosing the extent of the family conflict that has been occurring within the household. She shared with the SW Intern that she would like to move out of the home and move into a new apartment ASAP due to feeling like a burden at the current setting. She desires more freedom and less emotional family conflict. She stated she needs help finding an apartment and going through the process of moving due to the language barrier. SW Intern completed a risk assessment with Sabrina Garrison and Sabrina Garrison did disclose suicidal ideation and frequent thoughts that it will be better for everyone for her to pass away. SW Intern went over a brief safety plan with Sabrina Garrison and assured her that she has support. Sabrina Garrison stated that now that someone has come to see her and shows that they care about her that "it makes her want to live again." She also stated that once she moves out and that is her first priority, then should would like to open up and talk to the SW Intern more about her past and current challenges when she has more privacy and others cannot overhear. SW Intern assured Sabrina Garrison that she will begin the apartment search for Sabrina Garrison and her two sons. Sabrina Garrison also disclosed some pain with her teeth and wanting to get in to see a dentist as soon as possible. Her skin has maintained and her eczema has not flared. A follow-up session has been scheduled for Wed 1/23 at 8:30 to check in on Sabrina Garrison and her suicidal ideation and update on opportunities to move.    Sabrina Garrison, Student-Social Work 12/14/2017

## 2017-12-19 ENCOUNTER — Ambulatory Visit: Payer: Self-pay | Admitting: Licensed Clinical Social Worker

## 2017-12-19 ENCOUNTER — Other Ambulatory Visit: Payer: Self-pay | Admitting: Licensed Clinical Social Worker

## 2017-12-19 DIAGNOSIS — F439 Reaction to severe stress, unspecified: Secondary | ICD-10-CM

## 2017-12-20 NOTE — Progress Notes (Signed)
SW Intern did home visit with Sabrina Garrison and the interpreter Sabrina Garrison. SW Intern assessed Sabrina Garrison's depressive symptoms and for any suicidal ideation since last visit. Sabrina Garrison reported that she was feeling much better and happy to be visited. The SW Intern offered information to Sabrina Garrison about moving options and pricing in a local apartment for Sabrina Garrison and her two sons to transition into. Sabrina Garrison shared that there is not a consensus between family members on where they would like to move to. SW Intern encouraged family to come to a decision so that she can help them find an appropriate place and transition. Follow up appointment will be scheduled when interpreter determines new work schedule and availability.

## 2018-01-04 ENCOUNTER — Other Ambulatory Visit: Payer: Self-pay | Admitting: Internal Medicine

## 2018-01-04 ENCOUNTER — Ambulatory Visit: Payer: Medicaid Other | Admitting: Licensed Clinical Social Worker

## 2018-01-04 DIAGNOSIS — F439 Reaction to severe stress, unspecified: Secondary | ICD-10-CM

## 2018-01-04 NOTE — Progress Notes (Signed)
SW Intern met with Para Skeans and Interpreter [name] to assess family updates on a desire to move into a new unit and learn of any updated needs. Dawnelle Wyffels's son joined the session and disclosed that he does not want to move until a 2 bedroom rental house can be found that meets their budget. He does not want to move into an apartment. SW Intern assured the family she will look into options but that it may be difficult to find an affordable house that meets quality preferences at their budget. Other concerns noted were Rosita Brouillard feeling unwell for a few weeks now with sore throat, cough, occasional chills, and the most pressing concern, a continuous tooth ache for [facility] and a specific loose tooth she would like to get looked at by dentist. SW Intern will seek to set up dentist appt for a time that Interpreter can attend. Aya Geisel also stated that she did not receive SSI benefits into bank account last month. SW intern will look into this for clarity as to why as well as try to update address with medicaid at next visit. A follow-up visit was scheduled for Friday 2/22 at 8:30am.

## 2018-01-18 ENCOUNTER — Other Ambulatory Visit: Payer: Self-pay | Admitting: Licensed Clinical Social Worker

## 2018-02-08 ENCOUNTER — Ambulatory Visit: Payer: Medicaid Other | Admitting: Licensed Clinical Social Worker

## 2018-02-08 DIAGNOSIS — Z603 Acculturation difficulty: Secondary | ICD-10-CM

## 2018-02-08 NOTE — Progress Notes (Signed)
Social Work Intern completed home visit with Sabrina Garrison, her daughter in-law and Interpreter Sabrina Garrison. Sabrina Garrison presented with a positive mood and was excited to be visited after several weeks of it being difficult to schedule with an interpreter. [name]'s skin was noticeably more clear and Sw Intern assured Sabrina Garrison she would share that exciting news with [name]. Sabrina Garrison reported that she felt her prescribed medications were working well for her. After having to cancel a recent dental appointment set up for Sabrina Garrison due to interpreter's conflict with scheduling, Sabrina Garrison stated that she was still having dental pain but that they wanted to look into making an appointment with a different dentist than their previously scheduled visit because of recent bad reviews at the Burmese speaking dentist. SW Intern will look into a new dentist on the bus line that accepts Medicaid. Sabrina Garrison's daughter in law agreed to accompany her to the dental visit and felt that she could communicate necessary English. The family continues to report conflict within the home and a complex familial situation around moving into a different unit. Sabrina Garrison reported that her son's consumption of alcohol has continued to increase and that this is the primary reason her and her two sons have been asked to move into a separate home. Sabrina Garrison's daughter in law stated she would like to see him move into "section 8 housing or a government supported home" since his disability makes it difficult for him to receive any income to support the needs of the family. Sw Intern assured the family that housing options will continue to be explored for them but also reminded that finding a house on a budget as low as $600.00 was very challenging and that there are often long waiting lists to be able to receive any government supported housing. SW Intern will also follow up with a previous process that was started last year to try and apply for disability  benefits for the son. Sabrina Garrison's daughter in law stated [facility] was assisting him with filling out needed paperwork but they never received a follow-up or update on the process. SW Intern will also follow up with [name] to check on the status of Sabrina CroftShaw Butters's citizenship. Sabrina CroftShaw Wieand's daughter in law asked for her three children to be set up for check-up appointments at the clinic and SW Intern will arrange these dates and times. SW Intern will also ask [name] a question Sabrina BonineShaw Noe inquired about refilling a particular medication Thyrpid (Armour). A follow-up home visit was scheduled for 3/29 @ 8:30am.

## 2018-02-15 ENCOUNTER — Telehealth: Payer: Self-pay | Admitting: Internal Medicine

## 2018-02-15 MED ORDER — THYROID 30 MG PO TABS: 30.0000 mg | ORAL_TABLET | Freq: Every day | ORAL | 1 refills | Status: DC

## 2018-02-22 ENCOUNTER — Other Ambulatory Visit: Payer: Self-pay | Admitting: Licensed Clinical Social Worker

## 2018-03-01 ENCOUNTER — Ambulatory Visit: Payer: Medicaid Other | Admitting: Licensed Clinical Social Worker

## 2018-03-01 DIAGNOSIS — Z603 Acculturation difficulty: Secondary | ICD-10-CM

## 2018-03-01 NOTE — Progress Notes (Signed)
Social Work Intern met with Para Skeans and family and Astronomer. Oneka Crossan stated that today she "feels good." SWI assessed for any updates with Liley Kramar's son's disability process, and learned that he has had an interview and is waiting to learn how much disability income he will receive each month before budgeting and looking for a new place to live. SWI also offered update for Keyari Kleeman on her citizenship status after talking with Lysle Rubens and let Candie Gintz know that she should be receiving a letter in the mail with her scheduled interview that will likely be mid summer. SWI also assessed the status of Benedicta Kozikowski's grandchildren's Medicaid and assured that they each had a well-child visit appointment set up at [facility] in the coming months. SWI also called the clinic during visit to set [place] up with a fasting lab for Tuesday April 16th at 9:30 am. Wayne Brunker reported continued dental pain and SWI will set up appointment at [place] at either 9 or 10am whenever they have availability in the coming days to have her tooth pulled  when Jayana Remache's daughter in law can accompany her. 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 Towards the end of their meeting time together, Beonka Amesquita began to discuss how difficult her lack of independence feels and that she wished she did not have to rely on others. SWI assured Robinette Esters that community is a positive thing and that she is someone that people are happy to care for and be connected to. SWI ended the session offerering some brief yoga and breathing exercises with Avni Cinque to ease her feelings of stress. SWI will follow-up with family to let them know when dentist appointment is scheduled in the coming days.

## 2018-03-12 ENCOUNTER — Other Ambulatory Visit (INDEPENDENT_AMBULATORY_CARE_PROVIDER_SITE_OTHER): Payer: Medicaid Other

## 2018-03-12 DIAGNOSIS — E039 Hypothyroidism, unspecified: Secondary | ICD-10-CM | POA: Diagnosis not present

## 2018-03-12 DIAGNOSIS — Z79899 Other long term (current) drug therapy: Secondary | ICD-10-CM

## 2018-03-12 DIAGNOSIS — Z13220 Encounter for screening for lipoid disorders: Secondary | ICD-10-CM

## 2018-03-13 LAB — COMPREHENSIVE METABOLIC PANEL
A/G RATIO: 1.5 (ref 1.2–2.2)
ALT: 38 IU/L — ABNORMAL HIGH (ref 0–32)
AST: 49 IU/L — ABNORMAL HIGH (ref 0–40)
Albumin: 4.5 g/dL (ref 3.5–4.8)
Alkaline Phosphatase: 108 IU/L (ref 39–117)
BILIRUBIN TOTAL: 0.6 mg/dL (ref 0.0–1.2)
BUN / CREAT RATIO: 18 (ref 12–28)
BUN: 12 mg/dL (ref 8–27)
CHLORIDE: 105 mmol/L (ref 96–106)
CO2: 25 mmol/L (ref 20–29)
Calcium: 9.6 mg/dL (ref 8.7–10.3)
Creatinine, Ser: 0.68 mg/dL (ref 0.57–1.00)
GFR calc non Af Amer: 88 mL/min/{1.73_m2} (ref >59)
GFR, EST AFRICAN AMERICAN: 101 mL/min/{1.73_m2} (ref >59)
Globulin, Total: 3 g/dL (ref 1.5–4.5)
Glucose: 89 mg/dL (ref 65–99)
POTASSIUM: 4.3 mmol/L (ref 3.5–5.2)
Sodium: 142 mmol/L (ref 134–144)
TOTAL PROTEIN: 7.5 g/dL (ref 6.0–8.5)

## 2018-03-13 LAB — TSH: TSH: 12.79 u[IU]/mL — ABNORMAL HIGH (ref 0.450–4.500)

## 2018-03-13 LAB — CBC WITH DIFFERENTIAL/PLATELET
BASOS: 1 %
Basophils Absolute: 0 10*3/uL (ref 0.0–0.2)
EOS (ABSOLUTE): 0.1 10*3/uL (ref 0.0–0.4)
Eos: 2 %
Hematocrit: 42.6 % (ref 34.0–46.6)
Hemoglobin: 13.6 g/dL (ref 11.1–15.9)
IMMATURE GRANS (ABS): 0 10*3/uL (ref 0.0–0.1)
Immature Granulocytes: 0 %
LYMPHS ABS: 1.1 10*3/uL (ref 0.7–3.1)
Lymphs: 31 %
MCH: 28.6 pg (ref 26.6–33.0)
MCHC: 31.9 g/dL (ref 31.5–35.7)
MCV: 90 fL (ref 79–97)
MONOS ABS: 0.2 10*3/uL (ref 0.1–0.9)
Monocytes: 7 %
NEUTROS ABS: 2 10*3/uL (ref 1.4–7.0)
Neutrophils: 59 %
PLATELETS: 250 10*3/uL (ref 150–379)
RBC: 4.76 x10E6/uL (ref 3.77–5.28)
RDW: 14.1 % (ref 12.3–15.4)
WBC: 3.3 10*3/uL — ABNORMAL LOW (ref 3.4–10.8)

## 2018-03-13 LAB — LIPID PANEL W/O CHOL/HDL RATIO
CHOLESTEROL TOTAL: 266 mg/dL — AB (ref 100–199)
HDL: 59 mg/dL (ref >39)
LDL CALC: 185 mg/dL — AB (ref 0–99)
TRIGLYCERIDES: 109 mg/dL (ref 0–149)
VLDL CHOLESTEROL CAL: 22 mg/dL (ref 5–40)

## 2018-03-20 ENCOUNTER — Ambulatory Visit: Payer: Self-pay | Admitting: Licensed Clinical Social Worker

## 2018-03-20 DIAGNOSIS — Z603 Acculturation difficulty: Secondary | ICD-10-CM

## 2018-03-20 NOTE — Progress Notes (Signed)
Social Work Intern met with Sabrina Garrison and Sabrina Garrison for a final home visit. Sabrina Garrison reported that she was able to go through with her dentist appointment  yesterday with an interpreter and was able to complete the extraction of the tooth that has been bothering her. Sabrina Garrison reported that she feels "so much lighter" and better after removing the tooth and that her pain has decreased. She stated she was excited to be able to eat more solid foods without pain. Sabrina Garrison also reported that she has been having trouble with her ears including not hearing as well, feeling her ears are dry, and "hearing ticking sounds." Sabrina Garrison attributes her problems with her ears to her time in a [place] where many insects would enter her ears and bite. She feels her ears never properly healed and would like some drops or medication to help with her symptoms. SWI assured Sabrina Garrison she will share this concern with doctor and move forward with providing a solution. SWI thanked Sabrina Garrison for her hospitality and gracious spirit over this past year and for allowing her to be a small part of her life and journey. SWI assured Sabrina Garrison that another provider will continue to offer home visits to [facility] and her family and assist with any emerging needs.

## 2018-04-19 ENCOUNTER — Other Ambulatory Visit: Payer: Self-pay | Admitting: Internal Medicine

## 2018-04-19 DIAGNOSIS — L309 Dermatitis, unspecified: Secondary | ICD-10-CM

## 2018-04-19 MED ORDER — CLOBETASOL PROP EMOLLIENT BASE 0.05 % EX CREA: TOPICAL_CREAM | CUTANEOUS | 3 refills | Status: DC

## 2018-04-19 NOTE — Telephone Encounter (Signed)
See phone note--sending in refill for Clobetasol. Call into [facility] that now has refills Also needs to take her liquid Claritin as well 10 ml daily.

## 2019-03-31 NOTE — Telephone Encounter (Signed)
See note

## 2019-03-31 NOTE — Telephone Encounter (Signed)
See phone note

## 2019-07-15 ENCOUNTER — Other Ambulatory Visit: Payer: Self-pay

## 2019-07-15 ENCOUNTER — Ambulatory Visit: Payer: Medicaid Other | Admitting: Internal Medicine

## 2019-07-15 ENCOUNTER — Encounter: Payer: Self-pay | Admitting: Internal Medicine

## 2019-07-15 VITALS — BP 110/64 | HR 70 | Temp 97.4°F | Resp 12 | Ht <= 58 in | Wt 120.5 lb

## 2019-07-15 DIAGNOSIS — L309 Dermatitis, unspecified: Secondary | ICD-10-CM

## 2019-07-15 DIAGNOSIS — E039 Hypothyroidism, unspecified: Secondary | ICD-10-CM | POA: Diagnosis not present

## 2019-07-15 DIAGNOSIS — J302 Other seasonal allergic rhinitis: Secondary | ICD-10-CM | POA: Diagnosis not present

## 2019-07-15 MED ORDER — CLOBETASOL PROP EMOLLIENT BASE 0.05 % EX CREA
TOPICAL_CREAM | CUTANEOUS | 3 refills | Status: AC
Start: 2019-07-15 — End: ?

## 2019-07-15 MED ORDER — THYROID 30 MG PO TABS: 30.0000 mg | ORAL_TABLET | Freq: Every day | ORAL | 11 refills | Status: AC

## 2019-07-15 MED ORDER — LORATADINE 5 MG/5ML PO SYRP: ORAL_SOLUTION | ORAL | 11 refills | Status: AC

## 2019-07-15 NOTE — Progress Notes (Signed)
    Subjective:    Patient ID: Sabrina Garrison, female   DOB: May 22, 1946, 73 y.o.   MRN: [id number]   HPI   Mo Reh interprets  Has not been seen in some time.  Did not have someone to interpret for her.   She is still living with her two sons in [place].   Loran Senters is helping her out with interpretation Out of all of her medication for some time, including Armour thyroid. Her housing and furniture are fine She is having some food issues.  She has lost her food stamps. Mo is helping her get reassigned for support.   She does now have citizenship.  Hypothyroidism:  Out likely since March.  Losing hair.  Skin is itchy and dry.  Poor energy.   Stool is hard and hard to pass.   No melena or hematochezia.    2.  Allergies:  Headaches.  No sneezing.  Throat is itchy.  Prefers liquid antihistamine.  Was last on Loratadine.   No outpatient medications have been marked as taking for the 07/15/19 encounter (Office Visit) with Mack Hook, MD.   Allergies  Allergen Reactions  . Levothyroxine Swelling    Face, hands and feet.  Has had this twice--did not realize it was the same medication she had taken before with same side effect. However, stopped her Cetirizine when started Levothyroxine and the Cetirizine was controlling her rash/allergies     Review of Systems    Objective:   BP 110/64 (BP Location: Left Arm, Patient Position: Sitting, Cuff Size: Normal)   Pulse 70   Temp (!) 97.4 F (36.3 C)   Resp 12   Ht 4\' 9"  (1.448 m)   Wt 120 lb 8 oz (54.7 kg)   BMI 26.08 kg/m   Physical Exam  Pale appearing. NAD HEENT: PERRl, EOMI, eyes a bit watery.  Nasal musosa boggy with clear discharge. Mild posterior pharyngeal cobbling.  TMs clear. Neck:  Supple, No adenopathy, no thyromegaly.  Neck with anterior scarring Chest: CTA CV:  RRR without murmur or rub. Abd:  S, NT, No HSM or mass, + BS Skin:  Dry with deep skin folds--all over.    Assessment & Plan  1.   Hypothyroidism:  To restart Armour thyroid at 30 mg daily.    2.  Allergies:  Restart Claritin 10 mg daily.  3.  Eczema:  Clobetasol 0.05% small amt as needed once to twice daily to affected areas.

## 2019-08-15 ENCOUNTER — Other Ambulatory Visit: Payer: Medicaid Other

## 2019-08-15 ENCOUNTER — Other Ambulatory Visit: Payer: Self-pay

## 2019-08-20 ENCOUNTER — Ambulatory Visit: Payer: Medicaid Other | Admitting: Internal Medicine
